# Patient Record
Sex: Male | Born: 1980 | Race: White | Hispanic: No | Marital: Married | Smoking: Current every day smoker
Health system: Southern US, Community
[De-identification: ages and names within clinical notes are randomized; demographics above are authoritative.]

## PROBLEM LIST (undated history)

## (undated) DIAGNOSIS — E663 Overweight: Secondary | ICD-10-CM

## (undated) HISTORY — DX: Overweight: E66.3

---

## 2017-06-07 ENCOUNTER — Emergency Department
Admission: EM | Admit: 2017-06-07 | Discharge: 2017-06-07 | Disposition: A | Discharge status: Home / Self Care | Payer: BLUE CROSS/BLUE SHIELD | Attending: Emergency Medicine | Admitting: Emergency Medicine

## 2017-06-07 ENCOUNTER — Emergency Department: Payer: BLUE CROSS/BLUE SHIELD

## 2017-06-07 ENCOUNTER — Encounter: Payer: Self-pay | Admitting: Emergency Medicine

## 2017-06-07 DIAGNOSIS — Y9389 Activity, other specified: Secondary | ICD-10-CM | POA: Insufficient documentation

## 2017-06-07 DIAGNOSIS — S81812A Laceration without foreign body, left lower leg, initial encounter: Secondary | ICD-10-CM

## 2017-06-07 DIAGNOSIS — S81012A Laceration without foreign body, left knee, initial encounter: Secondary | ICD-10-CM | POA: Insufficient documentation

## 2017-06-07 DIAGNOSIS — W293XXA Contact with powered garden and outdoor hand tools and machinery, initial encounter: Secondary | ICD-10-CM | POA: Insufficient documentation

## 2017-06-07 DIAGNOSIS — Y929 Unspecified place or not applicable: Secondary | ICD-10-CM | POA: Diagnosis not present

## 2017-06-07 DIAGNOSIS — S8992XA Unspecified injury of left lower leg, initial encounter: Secondary | ICD-10-CM | POA: Diagnosis present

## 2017-06-07 DIAGNOSIS — F17290 Nicotine dependence, other tobacco product, uncomplicated: Secondary | ICD-10-CM | POA: Insufficient documentation

## 2017-06-07 DIAGNOSIS — Y999 Unspecified external cause status: Secondary | ICD-10-CM | POA: Diagnosis not present

## 2017-06-07 MED ORDER — SULFAMETHOXAZOLE-TRIMETHOPRIM 800-160 MG PO TABS: 1.0000 | ORAL_TABLET | Freq: Two times a day (BID) | ORAL | 0 refills | Status: AC

## 2017-06-07 MED ORDER — LIDOCAINE HCL (PF) 1 % IJ SOLN
INTRAMUSCULAR | Status: AC
  Filled 2017-06-07: qty 10

## 2017-06-07 MED ORDER — LIDOCAINE HCL 1 % IJ SOLN
10.0000 mL | Freq: Once | INTRAMUSCULAR | Status: DC
  Filled 2017-06-07: qty 10

## 2017-06-07 NOTE — ED Triage Notes (Signed)
Patient presents to the ED with laceration to his left knee.  Patient states he was using a chain saw and accidentally cut his knee.  Laceration is approx. 2.5 inches long and adipose tissue is exposed.  Patient reports very little pain and no impaired ability to walk.  Patient denies any numbness or tingling to left leg.  Patient states incident occurred around 4pm.

## 2017-06-07 NOTE — ED Provider Notes (Signed)
[facility] Emergency Department Provider Note  ____________________________________________  Time seen: Approximately 11:25 PM  I have reviewed the triage vital signs and the nursing notes.   HISTORY  Chief Complaint Extremity Laceration    HPI Steven Cantrell is a 37 y.o. male presents to the emergency department with a 4 cm left knee laceration sustained with a chainsaw accidentally.  Patient denies weakness, radiculopathy or changes in sensation of the lower extremities.  His tetanus status is up-to-date.   History reviewed. No pertinent past medical history.  There are no active problems to display for this patient.   History reviewed. No pertinent surgical history.  Prior to Admission medications   Medication Sig Start Date End Date Taking? Authorizing Provider  sulfamethoxazole-trimethoprim (BACTRIM DS,SEPTRA DS) 800-160 MG tablet Take 1 tablet by mouth 2 (two) times daily for 7 days. 06/07/17 06/14/17  Orvil Feil, PA-C    Allergies Patient has no known allergies.  No family history on file.  Social History Social History   Tobacco Use  . Smoking status: Current Every Day Smoker    Types: E-cigarettes  . Smokeless tobacco: Never Used  Substance Use Topics  . Alcohol use: Yes    Comment: beer  . Drug use: Not on file     Review of Systems  Constitutional: No fever/chills Eyes: No visual changes. No discharge ENT: No upper respiratory complaints. Cardiovascular: no chest pain. Respiratory: no cough. No SOB. Gastrointestinal: No abdominal pain.  No nausea, no vomiting.  No diarrhea.  No constipation. Musculoskeletal: Negative for musculoskeletal pain. Skin: Patient has left knee laceration.  Neurological: Negative for headaches, focal weakness or numbness.   ____________________________________________   PHYSICAL EXAM:  VITAL SIGNS: ED Triage Vitals [06/07/17 1745]  Enc Vitals Group     BP (!) 133/99     Pulse Rate (!)  117     Resp 18     Temp 99 F (37.2 C)     Temp Source Oral     SpO2 97 %     Weight 220 lb (99.8 kg)     Height 6\' 4"  (1.93 m)     Head Circumference      Peak Flow      Pain Score      Pain Loc      Pain Edu?      Excl. in GC?      Constitutional: Alert and oriented. Well appearing and in no acute distress. Eyes: Conjunctivae are normal. PERRL. EOMI. Head: Atraumatic. ENT: Cardiovascular: Normal rate, regular rhythm. Normal S1 and S2.  Good peripheral circulation. Respiratory: Normal respiratory effort without tachypnea or retractions. Lungs CTAB. Good air entry to the bases with no decreased or absent breath sounds. Musculoskeletal: Full range of motion to all extremities. No gross deformities appreciated. Neurologic:  Normal speech and language. No gross focal neurologic deficits are appreciated.  Skin: Patient has a 4 cm left knee laceration. Psychiatric: Mood and affect are normal. Speech and behavior are normal. Patient exhibits appropriate insight and judgement.   ____________________________________________   LABS (all labs ordered are listed, but only abnormal results are displayed)  Labs Reviewed - No data to display ____________________________________________  EKG   ____________________________________________  RADIOLOGY Geraldo Pitter, personally viewed and evaluated these images (plain radiographs) as part of my medical decision making, as well as reviewing the written report by the radiologist.  Dg Knee Complete 4 Views Left  Result Date: 06/07/2017 CLINICAL DATA:  37 year old male  with left knee laceration from chainsaw at 1600 hours today. EXAM: LEFT KNEE - COMPLETE 4+ VIEW COMPARISON:  None. FINDINGS: Deep soft tissue laceration is visible along the medial knee corresponding to the distal left femoral metadiaphysis (image 2). There injury appears to extend within a centimeter of the periosteum but no underlying osseous injury is identified. No  joint effusion. No gas within the joint space. Bone mineralization is within normal limits. Normal joint spaces and alignment. IMPRESSION: 1. Soft tissue laceration at the level of the medial distal left femoral metadiaphysis. The injury appears to extend within a centimeter of the periosteum but no fracture or bone injury is identified. 2. Normal joint spaces.  No joint effusion or gas identified. Electronically Signed   By: Odessa FlemingH  Hall M.D.   On: 06/07/2017 21:07    ____________________________________________    PROCEDURES  Procedure(s) performed:    Procedures  LACERATION REPAIR Performed by: Orvil FeilJaclyn M Keisuke Hollabaugh Authorized by: Orvil FeilJaclyn M Naethan Bracewell Consent: Verbal consent obtained. Risks and benefits: risks, benefits and alternatives were discussed Consent given by: patient Patient identity confirmed: provided demographic data Prepped and Draped in normal sterile fashion Wound explored  Laceration Location: Left knee   Laceration Length: 4 cm  No Foreign Bodies seen or palpated  Anesthesia: local infiltration  Local anesthetic: lidocaine 1% without epinephrine  Anesthetic total: 7 ml  Irrigation method: syringe Amount of cleaning: standard  Skin closure:  Deep: 5-0 Monocryl: 4 Superficial: 4-0 Ethilon: 9  Technique: Simple Interrupted   Patient tolerance: Patient tolerated the procedure well with no immediate complications.    Medications  lidocaine (XYLOCAINE) 1 % (with pres) injection 10 mL (not administered)  lidocaine (PF) (XYLOCAINE) 1 % injection (not administered)     ____________________________________________   INITIAL IMPRESSION / ASSESSMENT AND PLAN / ED COURSE  Pertinent labs & imaging results that were available during my care of the patient were reviewed by me and considered in my medical decision making (see chart for details).  Review of the [place] CSRS was performed in accordance of the [facility] prior to dispensing any controlled drugs.     Assessment  and plan Left knee laceration Patient presents to the emergency department with a 4 cm left knee laceration that was repaired in the emergency department without complication.  Patient was advised to have external sutures removed by primary care in 10 days.  Patient was discharged with Keflex and advised to follow-up with primary care as needed.  All patient questions were answered.    ____________________________________________  FINAL CLINICAL IMPRESSION(S) / ED DIAGNOSES  Final diagnoses:  Laceration of left lower extremity, initial encounter      NEW MEDICATIONS STARTED DURING THIS VISIT:  ED Discharge Orders        Ordered    sulfamethoxazole-trimethoprim (BACTRIM DS,SEPTRA DS) 800-160 MG tablet  2 times daily     06/07/17 2134          This chart was dictated using voice recognition software/Dragon. Despite best efforts to proofread, errors can occur which can change the meaning. Any change was purely unintentional.    Orvil FeilWoods, Niylah Hassan M, PA-C 06/07/17 2329    Emily FilbertWilliams, Jonathan E, MD 06/08/17 [phone]

## 2017-06-23 DIAGNOSIS — N200 Calculus of kidney: Secondary | ICD-10-CM

## 2017-06-23 HISTORY — DX: Calculus of kidney: N20.0

## 2017-07-21 ENCOUNTER — Other Ambulatory Visit: Payer: Self-pay

## 2017-07-21 ENCOUNTER — Encounter: Payer: Self-pay | Admitting: Emergency Medicine

## 2017-07-21 ENCOUNTER — Emergency Department
Admission: EM | Admit: 2017-07-21 | Discharge: 2017-07-21 | Disposition: A | Discharge status: Home / Self Care | Payer: BLUE CROSS/BLUE SHIELD | Attending: Emergency Medicine | Admitting: Emergency Medicine

## 2017-07-21 DIAGNOSIS — R319 Hematuria, unspecified: Secondary | ICD-10-CM

## 2017-07-21 DIAGNOSIS — N200 Calculus of kidney: Secondary | ICD-10-CM | POA: Diagnosis not present

## 2017-07-21 DIAGNOSIS — R109 Unspecified abdominal pain: Secondary | ICD-10-CM | POA: Diagnosis present

## 2017-07-21 DIAGNOSIS — F17290 Nicotine dependence, other tobacco product, uncomplicated: Secondary | ICD-10-CM | POA: Insufficient documentation

## 2017-07-21 LAB — URINALYSIS, COMPLETE (UACMP) WITH MICROSCOPIC
Bilirubin Urine: NEGATIVE
GLUCOSE, UA: NEGATIVE mg/dL
KETONES UR: 20 mg/dL — AB
Leukocytes, UA: NEGATIVE
NITRITE: NEGATIVE
PROTEIN: NEGATIVE mg/dL
Specific Gravity, Urine: 1.028 (ref 1.005–1.030)
Squamous Epithelial / LPF: NONE SEEN (ref 0–5)
WBC UA: NONE SEEN WBC/hpf (ref 0–5)
pH: 5 (ref 5.0–8.0)

## 2017-07-21 LAB — CBC
HEMATOCRIT: 43.7 % (ref 40.0–52.0)
HEMOGLOBIN: 15.2 g/dL (ref 13.0–18.0)
MCH: 30.7 pg (ref 26.0–34.0)
MCHC: 34.8 g/dL (ref 32.0–36.0)
MCV: 88.3 fL (ref 80.0–100.0)
Platelets: 256 10*3/uL (ref 150–440)
RBC: 4.95 MIL/uL (ref 4.40–5.90)
RDW: 13.5 % (ref 11.5–14.5)
WBC: 13.4 10*3/uL — ABNORMAL HIGH (ref 3.8–10.6)

## 2017-07-21 LAB — COMPREHENSIVE METABOLIC PANEL
ALBUMIN: 5 g/dL (ref 3.5–5.0)
ALT: 26 U/L (ref 17–63)
ANION GAP: 8 (ref 5–15)
AST: 34 U/L (ref 15–41)
Alkaline Phosphatase: 52 U/L (ref 38–126)
BUN: 19 mg/dL (ref 6–20)
CO2: 26 mmol/L (ref 22–32)
Calcium: 9.3 mg/dL (ref 8.9–10.3)
Chloride: 103 mmol/L (ref 101–111)
Creatinine, Ser: 1.95 mg/dL — ABNORMAL HIGH (ref 0.61–1.24)
GFR calc non Af Amer: 43 mL/min — ABNORMAL LOW (ref >60)
GFR, EST AFRICAN AMERICAN: 50 mL/min — AB (ref >60)
GLUCOSE: 138 mg/dL — AB (ref 65–99)
POTASSIUM: 4.4 mmol/L (ref 3.5–5.1)
SODIUM: 137 mmol/L (ref 135–145)
Total Bilirubin: 1.1 mg/dL (ref 0.3–1.2)
Total Protein: 7.6 g/dL (ref 6.5–8.1)

## 2017-07-21 LAB — LIPASE, BLOOD: LIPASE: 23 U/L (ref 11–51)

## 2017-07-21 MED ORDER — TAMSULOSIN HCL 0.4 MG PO CAPS: 0.4000 mg | ORAL_CAPSULE | Freq: Every day | ORAL | 0 refills | Status: DC

## 2017-07-21 MED ORDER — OXYCODONE-ACETAMINOPHEN 5-325 MG PO TABS
1.0000 | ORAL_TABLET | ORAL | Status: DC | PRN
  Administered 2017-07-21: 1 via ORAL

## 2017-07-21 MED ORDER — KETOROLAC TROMETHAMINE 60 MG/2ML IM SOLN
60.0000 mg | Freq: Once | INTRAMUSCULAR | Status: AC
  Administered 2017-07-21: 60 mg via INTRAMUSCULAR
  Filled 2017-07-21: qty 2

## 2017-07-21 MED ORDER — ONDANSETRON HCL 4 MG PO TABS: 4.0000 mg | ORAL_TABLET | Freq: Three times a day (TID) | ORAL | 0 refills | Status: DC | PRN

## 2017-07-21 MED ORDER — KETOROLAC TROMETHAMINE 10 MG PO TABS: 10.0000 mg | ORAL_TABLET | Freq: Three times a day (TID) | ORAL | 0 refills | Status: DC | PRN

## 2017-07-21 MED ORDER — OXYCODONE-ACETAMINOPHEN 5-325 MG PO TABS
ORAL_TABLET | ORAL | Status: AC
  Filled 2017-07-21: qty 1

## 2017-07-21 NOTE — ED Provider Notes (Signed)
[facility] Emergency Department Provider Note   ____________________________________________   I have reviewed the triage vital signs and the nursing notes.   HISTORY  Chief Complaint Abdominal Pain  History limited by: Not Limited  HPI Steven Cantrell is a 37 y.o. male who presents to the emergency department today because of concern of abdominal pain. Located in the left flank. Started this morning and woke him from sleep. Sharp. Severe. Has been accompanied by nausea and vomiting. States that the pain has started to move and radiate down to the groin. Has not noticed any obvious change in urination. No discoloration. No bad odor.    History reviewed. No pertinent past medical history.  There are no active problems to display for this patient.   History reviewed. No pertinent surgical history.  Prior to Admission medications   Not on File    Allergies Patient has no known allergies.  History reviewed. No pertinent family history.  Social History Social History   Tobacco Use  . Smoking status: Current Every Day Smoker    Types: E-cigarettes  . Smokeless tobacco: Never Used  Substance Use Topics  . Alcohol use: Yes    Comment: beer  . Drug use: Not on file    Review of Systems Constitutional: No fever/chills Eyes: No visual changes. ENT: No sore throat. Cardiovascular: Denies chest pain. Respiratory: Denies shortness of breath. Gastrointestinal: Positive for left flank.  Genitourinary: Negative for dysuria. Musculoskeletal: Negative for back pain. Skin: Negative for rash. Neurological: Negative for headaches, focal weakness or numbness.  ____________________________________________   PHYSICAL EXAM:  VITAL SIGNS: ED Triage Vitals  Enc Vitals Group     BP 07/21/17 1627 122/83     Pulse Rate 07/21/17 1627 61     Resp 07/21/17 1627 18     Temp 07/21/17 1627 98.8 F (37.1 C)     Temp Source 07/21/17 1627 Oral     SpO2 07/21/17  1627 99 %     Weight 07/21/17 1626 220 lb (99.8 kg)     Height 07/21/17 1626  (1.93 m)     Head Circumference --      Peak Flow --      Pain Score 07/21/17 1626 6   Constitutional: Alert and oriented. Well appearing and in no distress. Eyes: Conjunctivae are normal.  ENT   Head: Normocephalic and atraumatic.   Nose: No congestion/rhinnorhea.   Mouth/Throat: Mucous membranes are moist.   Neck: No stridor. Hematological/Lymphatic/Immunilogical: No cervical lymphadenopathy. Cardiovascular: Normal rate, regular rhythm.  No murmurs, rubs, or gallops.  Respiratory: Normal respiratory effort without tachypnea nor retractions. Breath sounds are clear and equal bilaterally. No wheezes/rales/rhonchi. Gastrointestinal: Soft and minimally tender in the left abdomen. Genitourinary: Deferred Musculoskeletal: Normal range of motion in all extremities. No lower extremity edema. Neurologic:  Normal speech and language. No gross focal neurologic deficits are appreciated.  Skin:  Skin is warm, dry and intact. No rash noted. Psychiatric: Mood and affect are normal. Speech and behavior are normal. Patient exhibits appropriate insight and judgment.  ____________________________________________    LABS (pertinent positives/negatives)  Lipase 23 CMP glu 138, cr 1.95 CBC wbc 13.4, hgb 15.2, plt 256 UA 6-10 RBCs, moderate urine, none seen WBC ____________________________________________   EKG  None  ____________________________________________    RADIOLOGY  None  ____________________________________________   PROCEDURES  Procedures  ____________________________________________   INITIAL IMPRESSION / ASSESSMENT AND PLAN / ED COURSE  Pertinent labs & imaging results that were available during my  care of the patient were reviewed by me and considered in my medical decision making (see chart for details).  Patient presented to the emergency department today with  concern for left sided flank and radiating to groin pain.  Differential would be broad including diverticulitis, kidney stones, pyelonephritis, lower urinary tract infection on his other etiologies.  Patient did have red blood cells in his urine.  At this time I think most consistent with kidney stones.  Did offer ultrasound to evaluate for possible obstruction or hydronephrosis.  Patient felt comfortable deferring at this time.  He did get good pain control after Toradol injection.  Discussed return precautions.  Will discharge with pain medication nausea medication and Flomax.   ____________________________________________   FINAL CLINICAL IMPRESSION(S) / ED DIAGNOSES  Final diagnoses:  Kidney stone  Hematuria, unspecified type     Note: This dictation was prepared with Dragon dictation. Any transcriptional errors that result from this process are unintentional     Phineas Semen, MD 07/21/17 1904

## 2017-07-21 NOTE — Discharge Instructions (Addendum)
Please seek medical attention for any high fevers, chest pain, shortness of breath, change in behavior, persistent vomiting, bloody stool or any other new or concerning symptoms.  

## 2017-07-21 NOTE — ED Triage Notes (Signed)
C/o LLQ pain starting this AM.  Denies diarrhea, did vomit this AM.  Starting to move into groin. Denies hematuria.  Had flank pain this morning but now mostly in abdomen.

## 2018-04-29 ENCOUNTER — Emergency Department: Payer: BLUE CROSS/BLUE SHIELD

## 2018-04-29 ENCOUNTER — Other Ambulatory Visit: Payer: Self-pay

## 2018-04-29 ENCOUNTER — Emergency Department
Admission: EM | Admit: 2018-04-29 | Discharge: 2018-04-29 | Disposition: A | Discharge status: Home / Self Care | Payer: BLUE CROSS/BLUE SHIELD | Attending: Emergency Medicine | Admitting: Emergency Medicine

## 2018-04-29 DIAGNOSIS — F17290 Nicotine dependence, other tobacco product, uncomplicated: Secondary | ICD-10-CM | POA: Insufficient documentation

## 2018-04-29 DIAGNOSIS — N289 Disorder of kidney and ureter, unspecified: Secondary | ICD-10-CM | POA: Insufficient documentation

## 2018-04-29 DIAGNOSIS — R1031 Right lower quadrant pain: Secondary | ICD-10-CM | POA: Diagnosis present

## 2018-04-29 DIAGNOSIS — N23 Unspecified renal colic: Secondary | ICD-10-CM

## 2018-04-29 DIAGNOSIS — N1330 Unspecified hydronephrosis: Secondary | ICD-10-CM | POA: Diagnosis not present

## 2018-04-29 LAB — BASIC METABOLIC PANEL
Anion gap: 7 (ref 5–15)
BUN: 17 mg/dL (ref 6–20)
CHLORIDE: 104 mmol/L (ref 98–111)
CO2: 25 mmol/L (ref 22–32)
CREATININE: 1.63 mg/dL — AB (ref 0.61–1.24)
Calcium: 9.2 mg/dL (ref 8.9–10.3)
GFR calc Af Amer: >60 mL/min (ref >60)
GFR calc non Af Amer: 53 mL/min — ABNORMAL LOW (ref >60)
Glucose, Bld: 114 mg/dL — ABNORMAL HIGH (ref 70–99)
Potassium: 4 mmol/L (ref 3.5–5.1)
Sodium: 136 mmol/L (ref 135–145)

## 2018-04-29 LAB — URINALYSIS, COMPLETE (UACMP) WITH MICROSCOPIC
BACTERIA UA: NONE SEEN
Bilirubin Urine: NEGATIVE
Glucose, UA: NEGATIVE mg/dL
Ketones, ur: NEGATIVE mg/dL
Leukocytes, UA: NEGATIVE
Nitrite: NEGATIVE
PROTEIN: 30 mg/dL — AB
RBC / HPF: >50 RBC/hpf — ABNORMAL HIGH (ref 0–5)
SPECIFIC GRAVITY, URINE: 1.017 (ref 1.005–1.030)
pH: 5 (ref 5.0–8.0)

## 2018-04-29 LAB — CBC
HEMATOCRIT: 43.1 % (ref 39.0–52.0)
Hemoglobin: 15.1 g/dL (ref 13.0–17.0)
MCH: 30.1 pg (ref 26.0–34.0)
MCHC: 35 g/dL (ref 30.0–36.0)
MCV: 86 fL (ref 80.0–100.0)
Platelets: 257 10*3/uL (ref 150–400)
RBC: 5.01 MIL/uL (ref 4.22–5.81)
RDW: 12.2 % (ref 11.5–15.5)
WBC: 14.1 10*3/uL — ABNORMAL HIGH (ref 4.0–10.5)
nRBC: 0 % (ref 0.0–0.2)

## 2018-04-29 MED ORDER — TAMSULOSIN HCL 0.4 MG PO CAPS: 0.4000 mg | ORAL_CAPSULE | Freq: Every day | ORAL | 0 refills | Status: DC

## 2018-04-29 MED ORDER — ONDANSETRON HCL 4 MG PO TABS: 4.0000 mg | ORAL_TABLET | Freq: Three times a day (TID) | ORAL | 0 refills | Status: DC | PRN

## 2018-04-29 MED ORDER — KETOROLAC TROMETHAMINE 10 MG PO TABS: 10.0000 mg | ORAL_TABLET | Freq: Three times a day (TID) | ORAL | 0 refills | Status: DC | PRN

## 2018-04-29 MED ORDER — OXYCODONE-ACETAMINOPHEN 5-325 MG PO TABS: 1.0000 | ORAL_TABLET | ORAL | 0 refills | Status: AC | PRN

## 2018-04-29 NOTE — ED Provider Notes (Addendum)
[facility] Emergency Department Provider Note  ____________________________________________  Time seen: Approximately 6:22 PM  I have reviewed the triage vital signs and the nursing notes.   HISTORY  Chief Complaint Flank Pain    HPI Steven Cantrell is a 38 y.o. male with a prior history of left renal colic approximately 1 year ago presenting with right lower quadrant pain radiating to the right testicle.  The patient reports that several hours ago he began to have discomfort around the right flank to the right lower quadrant, with hematuria.  He had one episode of nausea and vomiting.  He had some leftover pain medication and antiemetic from his last kidney stone, so took that and now his symptoms have completely resolved.  He has a little bit of "pressure" sensation suprapubically.  He has not had any fevers or chills.  His last creatinine was elevated at 1.96 and he never followed up with urology; he states he went to an occupational health physician who told him his kidney function had returned returned to normal.  History reviewed. No pertinent past medical history.  There are no active problems to display for this patient.   History reviewed. No pertinent surgical history.  Current Outpatient Rx  . Order #: [id number] Class: Print  . Order #: [id number] Class: Print  . Order #: [id number] Class: Print  . Order #: [id number] Class: Print    Allergies Patient has no known allergies.  History reviewed. No pertinent family history.  Social History Social History   Tobacco Use  . Smoking status: Current Every Day Smoker    Types: E-cigarettes  . Smokeless tobacco: Never Used  Substance Use Topics  . Alcohol use: Yes    Comment: beer occ   . Drug use: Not on file    Review of Systems Constitutional: No fever/chills.  No lightheadedness or syncope. Eyes: No visual changes. ENT: No sore throat. No congestion or rhinorrhea. Cardiovascular: Denies chest  pain. Denies palpitations. Respiratory: Denies shortness of breath.  No cough. Gastrointestinal: Positive right flank pain associated with right lower quadrant abdominal pain.  Positive nausea, positive vomiting.  No diarrhea.  No constipation. Genitourinary: Negative for dysuria.  Positive hematuria. Musculoskeletal: Negative for back pain. Skin: Negative for rash. Neurological: Negative for headaches. No focal numbness, tingling or weakness.     ____________________________________________   PHYSICAL EXAM:  VITAL SIGNS: ED Triage Vitals [04/29/18 1710]  Enc Vitals Group     BP (!) 109/97     Pulse Rate 75     Resp 16     Temp 98.1 F (36.7 C)     Temp Source Oral     SpO2 99 %     Weight 220 lb (99.8 kg)     Height 6\' 4"  (1.93 m)     Head Circumference      Peak Flow      Pain Score 1     Pain Loc      Pain Edu?      Excl. in GC?     Constitutional: Alert and oriented. Answers questions appropriately. Eyes: Conjunctivae are normal.  EOMI. No scleral icterus. Head: Atraumatic. Nose: No congestion/rhinnorhea. Mouth/Throat: Mucous membranes are moist.  Neck: No stridor.  Supple.  No JVD.  No meningismus. Cardiovascular: Normal rate, regular rhythm. No murmurs, rubs or gallops.  Respiratory: Normal respiratory effort.  No accessory muscle use or retractions. Lungs CTAB.  No wheezes, rales or ronchi. Gastrointestinal: Soft, and nondistended.  Minimal tenderness to palpation in  the distal aspect of the right lower quadrant.  No guarding or rebound.  No peritoneal signs.  No reproducible CVA tenderness to palpation on the left or the right. Musculoskeletal: No LE edema. Neurologic:  A&Ox3.  Speech is clear.  Face and smile are symmetric.  EOMI.  Moves all extremities well. Skin:  Skin is warm, dry and intact. No rash noted. Psychiatric: Mood and affect are normal. Speech and behavior are normal.  Normal judgement.  ____________________________________________    LABS (all labs ordered are listed, but only abnormal results are displayed)  Labs Reviewed  URINALYSIS, COMPLETE (UACMP) WITH MICROSCOPIC - Abnormal; Notable for the following components:      Result Value   Color, Urine AMBER (*)    APPearance CLOUDY (*)    Hgb urine dipstick LARGE (*)    Protein, ur 30 (*)    RBC / HPF >50 (*)    All other components within normal limits  BASIC METABOLIC PANEL - Abnormal; Notable for the following components:   Glucose, Bld 114 (*)    Creatinine, Ser 1.63 (*)    GFR calc non Af Amer 53 (*)    All other components within normal limits  CBC - Abnormal; Notable for the following components:   WBC 14.1 (*)    All other components within normal limits   ____________________________________________  EKG  Not indicated ____________________________________________  RADIOLOGY  Ct Renal Stone Study  Result Date: 04/29/2018 CLINICAL DATA:  Right flank pain since early this afternoon. EXAM: CT ABDOMEN AND PELVIS WITHOUT CONTRAST TECHNIQUE: Multidetector CT imaging of the abdomen and pelvis was performed following the standard protocol without IV contrast. COMPARISON:  None. FINDINGS: Lower chest: The lung bases are clear of acute process. No pleural effusion or pulmonary lesions. The heart is normal in size. No pericardial effusion. The distal esophagus and aorta are unremarkable. Hepatobiliary: No focal hepatic lesions or intrahepatic biliary dilatation. The gallbladder is normal. No common bile duct dilatation. Pancreas: No mass, inflammation or ductal dilatation. Spleen: Normal size.  No focal lesions. Adrenals/Urinary Tract: The adrenal glands are normal. No renal calculi. There is mild/moderate right-sided hydroureteronephrosis down to an obstructing 3 mm calculus in the distal right ureter just proximal to the UVJ. No left-sided ureteral calculi. No bladder calculi. No worrisome renal or bladder lesions without contrast. Stomach/Bowel: The stomach,  duodenum, small bowel and colon are grossly normal without oral contrast. No inflammatory changes, mass lesions or obstructive findings. The terminal ileum and appendix are normal. Vascular/Lymphatic: The aorta is normal in caliber. No atheroscerlotic calcifications. No mesenteric of retroperitoneal mass or adenopathy. Small scattered lymph nodes are noted. Reproductive: The prostate gland and seminal vesicles are unremarkable. Bilateral scrotal hydroceles are noted. Other: No pelvic mass or adenopathy. No free pelvic fluid collections. No inguinal mass or adenopathy. No abdominal wall hernia or subcutaneous lesions. Musculoskeletal: No significant findings. IMPRESSION: 1. 3 mm distal right ureteral calculus causing mild/moderate hydroureteronephrosis. 2. No definite renal calculi and no left ureteral calculi. 3. No other significant abdominal/pelvic findings. Electronically Signed   By: Rudie MeyerP.  Gallerani M.D.   On: 04/29/2018 18:40    ____________________________________________   PROCEDURES  Procedure(s) performed: None  Procedures  Critical Care performed: No ____________________________________________   INITIAL IMPRESSION / ASSESSMENT AND PLAN / ED COURSE  Pertinent labs & imaging results that were available during my care of the patient were reviewed by me and considered in my medical decision making (see chart for details).  38 y.o. male with a  history of renal colic presenting with right flank pain radiating to the right lower quadrant, nausea and vomiting and hematuria.  The patient is hemodynamically stable and afebrile.  His clinical examination shows some right pelvic pain but is otherwise reassuring.  We will get a CT for further evaluation of stone disease.  Early appendicitis is not excluded.  The patient does have a significant amount of hematuria in his urinalysis without any evidence of infection.  Today, his creatinine is better than on prior visit but still abnormal at 1.63 and I  have asked him to please follow-up with urology for continued monitoring.  He does have an elevation in his white blood cell count at 14.  Plan reevaluation for final disposition.  ----------------------------------------- 7:10 PM on 04/29/2018 -----------------------------------------  The patient has a CT scan which is shows a 3 mm stone at the right UV J with some hydroureteronephrosis.  Overall, he continues to be hemodynamically stable and afebrile.  He is completely asymptomatic at this time.  He is aware that his white blood cell count is elevated and that his kidney function is not completely normal, so he will make an appointment for follow-up within the next 2 to 3 days with Dr. Apolinar Junes, the urologist on-call.  He has been able to drink several bottles of water since he has arrived here and I have talked to him about the importance of continued clear liquid p.o. intake.  Follow-up instructions as well as return precautions have been discussed.  Initially, had printed a Toradol prescription for the patient but I have voided this due to his renal insufficiency and given him precautions about NSAIDs.  He will use Tylenol and Percocet as needed for pain. ____________________________________________  FINAL CLINICAL IMPRESSION(S) / ED DIAGNOSES  Final diagnoses:  Renal insufficiency  Renal colic on right side  Hydroureteronephrosis         NEW MEDICATIONS STARTED DURING THIS VISIT:  New Prescriptions   OXYCODONE-ACETAMINOPHEN (PERCOCET) 5-325 MG TABLET    Take 1 tablet by mouth every 4 (four) hours as needed for severe pain.      Rockne Menghini, MD 04/29/18 1911    Rockne Menghini, MD 04/29/18 [phone]

## 2018-04-29 NOTE — Discharge Instructions (Addendum)
Please drink plenty of fluid to help your kidney stone pass, and to improve your abnormal kidney function.  We strain your urine and bring your kidney stone with you to your urology follow-up if you capture it.  For mild to moderate pain, you may take Tylenol.  Percocet is for severe pain; do not drive within 8 hours of taking Percocet.  Do not take any NSAID medications including Toradol, Advil, Motrin, ibuprofen; this can worsen your kidney function.  Please make a follow-up appoint with Dr. Apolinar Junes, the urologist on-call today.  Return to the emergency department if you develop fever, inability to keep down fluids, or pain that is not controlled with medications, or for any other symptoms concerning to you.

## 2018-04-29 NOTE — ED Notes (Signed)
ED Provider at bedside. 

## 2018-04-29 NOTE — ED Triage Notes (Signed)
Pt A&O, ambulatory. States believes he has a kidney stone. R flank pain. Hx of kidney stone a year ago. States hematuria. Vomiting x 1. Took nausea and pain medicine at home. Symptoms began today at 1:30.

## 2019-09-02 NOTE — Progress Notes (Signed)
Scheduled to complete physical on 09/09/19 with Durward Parcel, PA-C.  AMD

## 2019-09-03 ENCOUNTER — Other Ambulatory Visit: Payer: Self-pay

## 2019-09-03 ENCOUNTER — Ambulatory Visit: Payer: Self-pay

## 2019-09-03 DIAGNOSIS — Z0000 Encounter for general adult medical examination without abnormal findings: Secondary | ICD-10-CM

## 2019-09-03 LAB — POCT URINALYSIS DIPSTICK
Bilirubin, UA: NEGATIVE
Blood, UA: NEGATIVE
Glucose, UA: NEGATIVE
Leukocytes, UA: NEGATIVE
Nitrite, UA: NEGATIVE
Protein, UA: POSITIVE — AB
Spec Grav, UA: >=1.03 — AB (ref 1.010–1.025)
Urobilinogen, UA: 0.2 E.U./dL
pH, UA: 5 (ref 5.0–8.0)

## 2019-09-04 LAB — CMP12+LP+TP+TSH+6AC+CBC/D/PLT
ALT: 18 IU/L (ref 0–44)
AST: 15 IU/L (ref 0–40)
Albumin/Globulin Ratio: 2.6 — ABNORMAL HIGH (ref 1.2–2.2)
Albumin: 4.9 g/dL (ref 4.0–5.0)
Alkaline Phosphatase: 61 IU/L (ref 48–121)
BUN/Creatinine Ratio: 8 — ABNORMAL LOW (ref 9–20)
BUN: 12 mg/dL (ref 6–20)
Basophils Absolute: 0.1 10*3/uL (ref 0.0–0.2)
Basos: 1 %
Bilirubin Total: 0.5 mg/dL (ref 0.0–1.2)
Calcium: 9.5 mg/dL (ref 8.7–10.2)
Chloride: 103 mmol/L (ref 96–106)
Chol/HDL Ratio: 3.8 ratio (ref 0.0–5.0)
Cholesterol, Total: 150 mg/dL (ref 100–199)
Creatinine, Ser: 1.46 mg/dL — ABNORMAL HIGH (ref 0.76–1.27)
EOS (ABSOLUTE): 0.2 10*3/uL (ref 0.0–0.4)
Eos: 3 %
Estimated CHD Risk: 0.7 times avg. (ref 0.0–1.0)
Free Thyroxine Index: 2.2 (ref 1.2–4.9)
GFR calc Af Amer: 70 mL/min/{1.73_m2} (ref >59)
GFR calc non Af Amer: 61 mL/min/{1.73_m2} (ref >59)
GGT: 13 IU/L (ref 0–65)
Globulin, Total: 1.9 g/dL (ref 1.5–4.5)
Glucose: 102 mg/dL — ABNORMAL HIGH (ref 65–99)
HDL: 40 mg/dL (ref >39)
Hematocrit: 45.8 % (ref 37.5–51.0)
Hemoglobin: 15.6 g/dL (ref 13.0–17.7)
Immature Grans (Abs): 0 10*3/uL (ref 0.0–0.1)
Immature Granulocytes: 0 %
Iron: 110 ug/dL (ref 38–169)
LDH: 178 IU/L (ref 121–224)
LDL Chol Calc (NIH): 88 mg/dL (ref 0–99)
Lymphocytes Absolute: 2.2 10*3/uL (ref 0.7–3.1)
Lymphs: 36 %
MCH: 29.3 pg (ref 26.6–33.0)
MCHC: 34.1 g/dL (ref 31.5–35.7)
MCV: 86 fL (ref 79–97)
Monocytes Absolute: 0.6 10*3/uL (ref 0.1–0.9)
Monocytes: 10 %
Neutrophils Absolute: 3.2 10*3/uL (ref 1.4–7.0)
Neutrophils: 50 %
Phosphorus: 3 mg/dL (ref 2.8–4.1)
Platelets: 270 10*3/uL (ref 150–450)
Potassium: 4.8 mmol/L (ref 3.5–5.2)
RBC: 5.33 x10E6/uL (ref 4.14–5.80)
RDW: 12.5 % (ref 11.6–15.4)
Sodium: 139 mmol/L (ref 134–144)
T3 Uptake Ratio: 31 % (ref 24–39)
T4, Total: 7 ug/dL (ref 4.5–12.0)
TSH: 2.1 u[IU]/mL (ref 0.450–4.500)
Total Protein: 6.8 g/dL (ref 6.0–8.5)
Triglycerides: 119 mg/dL (ref 0–149)
Uric Acid: 6.9 mg/dL (ref 3.8–8.4)
VLDL Cholesterol Cal: 22 mg/dL (ref 5–40)
WBC: 6.2 10*3/uL (ref 3.4–10.8)

## 2019-09-09 ENCOUNTER — Ambulatory Visit: Payer: Self-pay | Admitting: Physician Assistant

## 2019-09-09 ENCOUNTER — Encounter: Payer: Self-pay | Admitting: Physician Assistant

## 2019-09-09 ENCOUNTER — Other Ambulatory Visit: Payer: Self-pay

## 2019-09-09 VITALS — BP 117/79 | HR 88 | Temp 97.0°F | Resp 14 | Ht 76.0 in | Wt 221.0 lb

## 2019-09-09 DIAGNOSIS — Z0000 Encounter for general adult medical examination without abnormal findings: Secondary | ICD-10-CM

## 2019-09-09 NOTE — Progress Notes (Signed)
° °  Subjective: Annual physical exam    Patient ID: Steven Cantrell, male    DOB: [birth year], 39 y.o.   MRN: [id number]  HPI Patient presents annual physical exam voices no concerns or complaints.   Review of Systems    Negative Objective:   Physical Exam No acute distress.  HEENT is unremarkable.  Neck is supple for adenopathy or bruits.  Lungs are clear to auscultation.  Heart regular rate and rhythm.  Abdomen with negative HSM, normoactive bowel sounds, soft, and nontender to palpation.  No obvious deformity to the upper or lower extremities.  Patient has full and equal range of motion of the upper and lower extremities.  No cervical or lumbar spine deformity.  Patient has full and equal range of motion of the cervical lumbar spine.  Cranial nerves II through XII are grossly intact.       Assessment & Plan: Well exam  Discussed lab results with patient.  Advised follow-up as needed.

## 2019-10-25 IMAGING — CT CT RENAL STONE PROTOCOL
3 of 4 series · 8 of 46 positions shown, 15 images · non-contrast
Comparison: None.

CLINICAL DATA: Right flank pain since early this afternoon.

EXAM:
CT ABDOMEN AND PELVIS WITHOUT CONTRAST
TECHNIQUE: Multidetector CT imaging of the abdomen and pelvis was performed
following the standard protocol without IV contrast.

[Series 4: lung bases · axial · 0.73mm/px · z∈[-153,-83]mm · 4 of 24 slices shown, 9 images]
[im 5/24  soft-tissue]
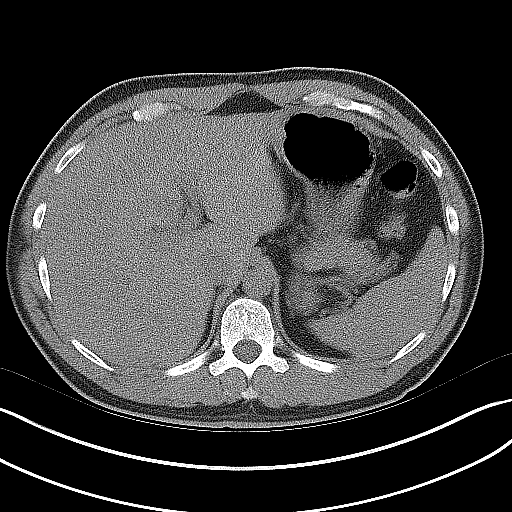
[im 5/24  lung]
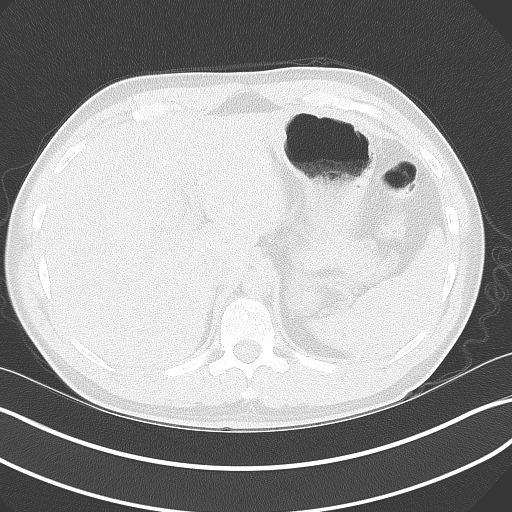
[im 5/24  bone]
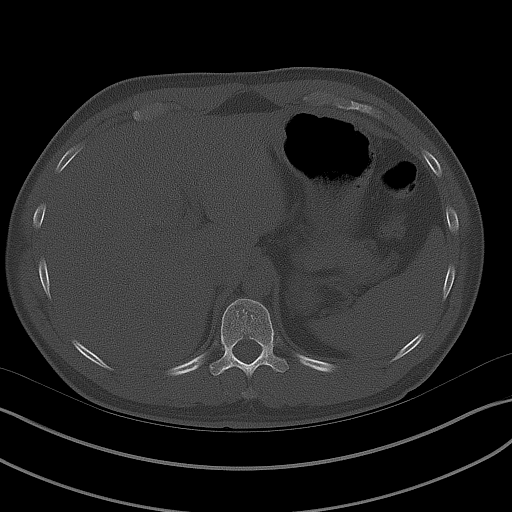
[im 10/24  soft-tissue]
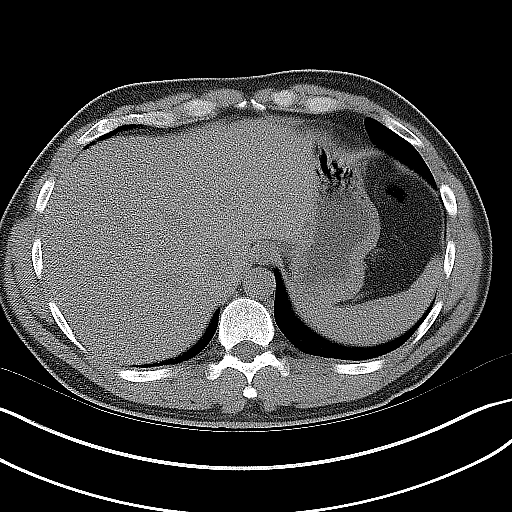
[im 10/24  lung]
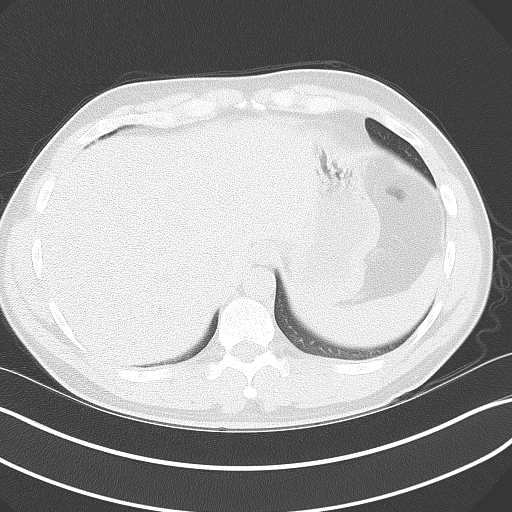
[im 14/24  soft-tissue]
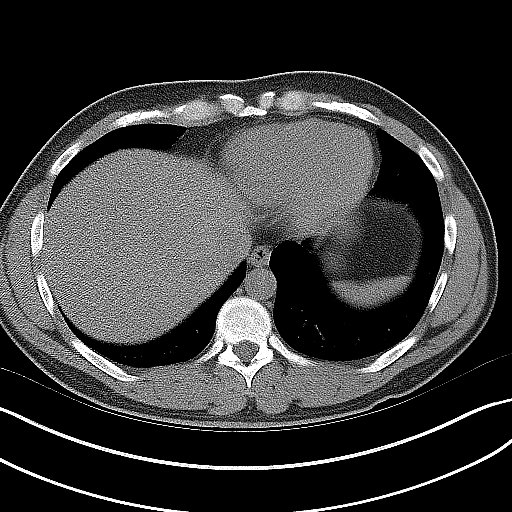
[im 14/24  lung]
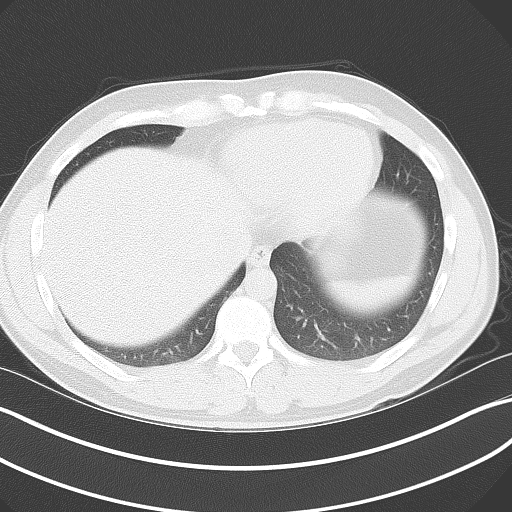
[im 19/24  soft-tissue]
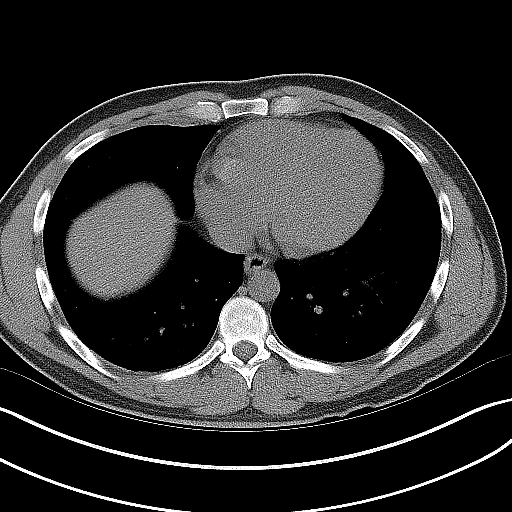
[im 19/24  lung]
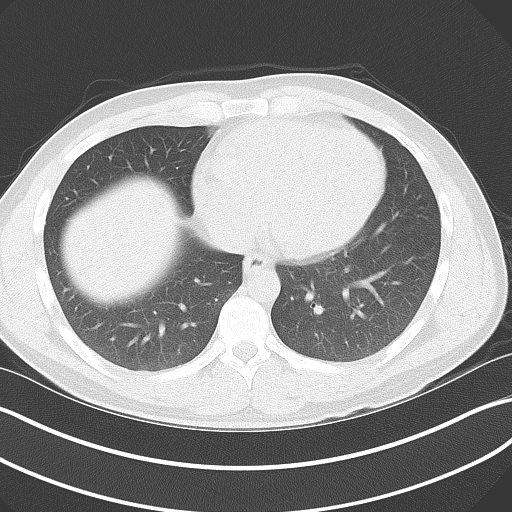

[Series 5: coronal · coronal · 0.81mm/px · 3 of 138 slices shown, 4 images]
[im 46/138  soft-tissue]
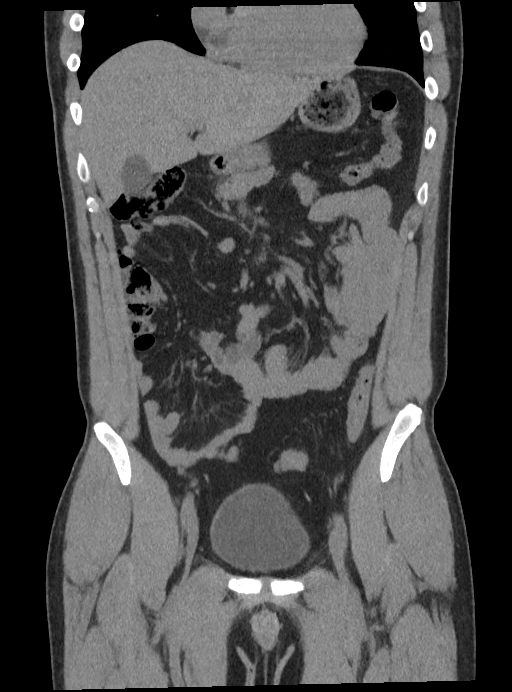
[im 61/138  soft-tissue]
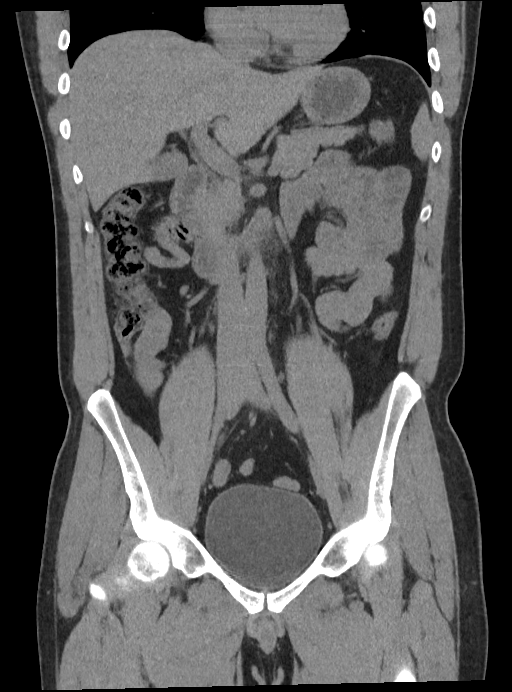
[im 61/138  bone]
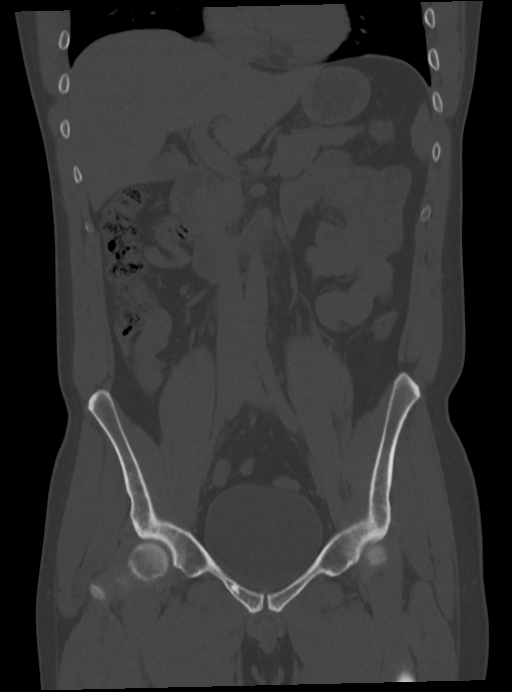
[im 77/138  soft-tissue]
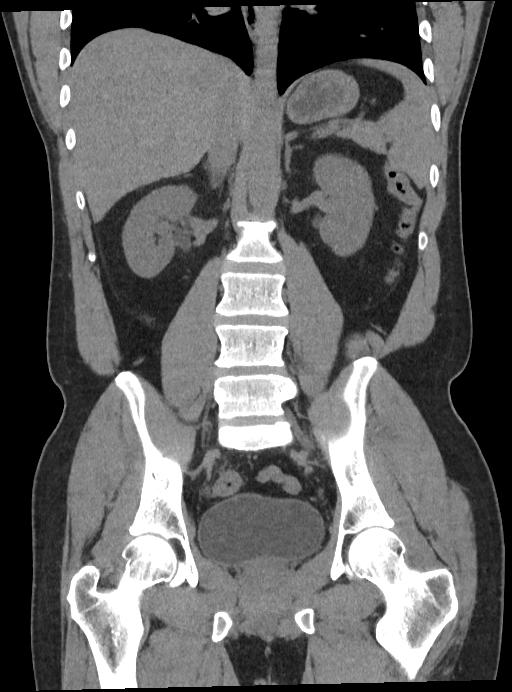

[Series 6: sagittal · sagittal · 0.54mm/px · 1 of 186 slices shown, 2 images]
[im 62/186  soft-tissue]
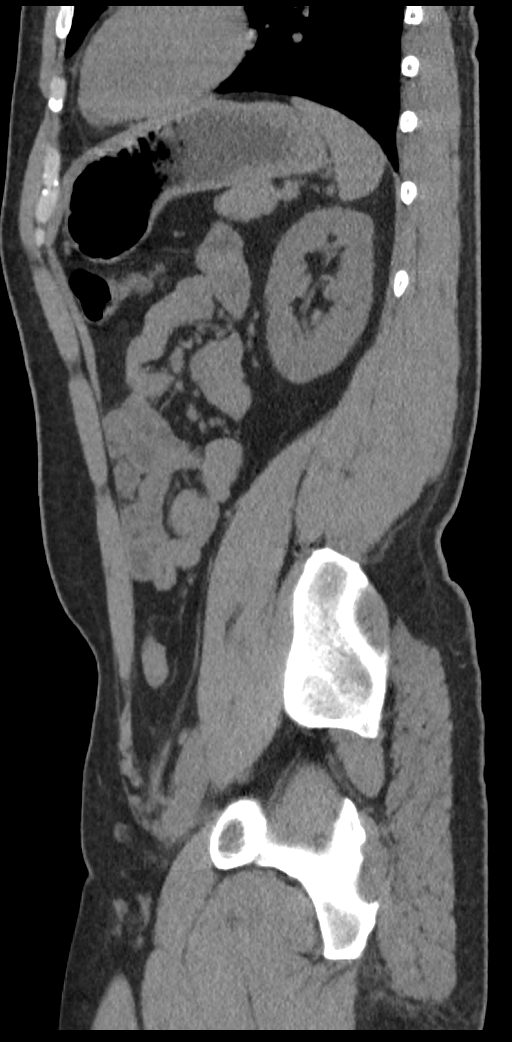
[im 62/186  bone]
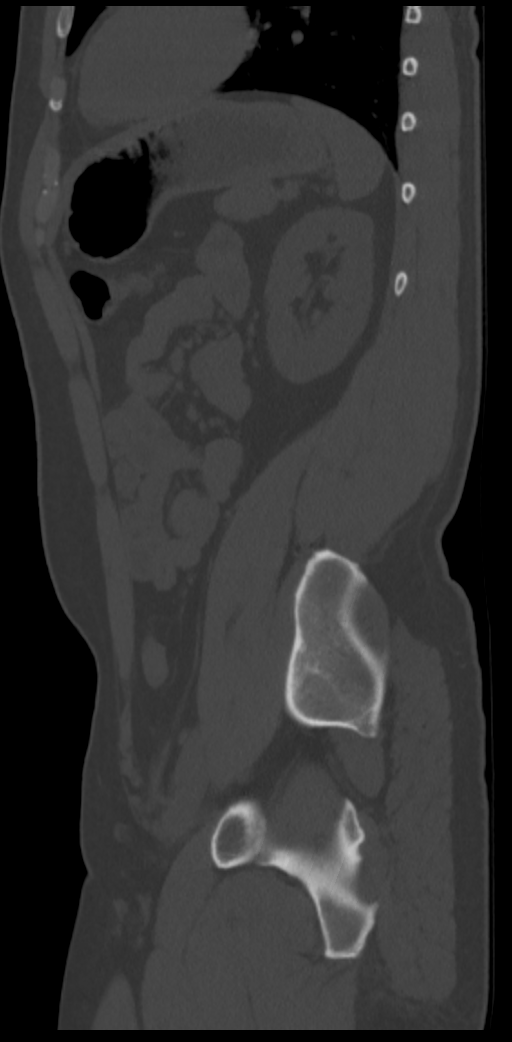

[8 of 46 positions shown; findings below may reference images not displayed]

FINDINGS: Lower chest: The lung bases are clear of acute process. No pleural
effusion or pulmonary lesions. The heart is normal in size. No
pericardial effusion. The distal esophagus and aorta are
unremarkable.

Hepatobiliary: No focal hepatic lesions or intrahepatic biliary
dilatation. The gallbladder is normal. No common bile duct
dilatation.

Pancreas: No mass, inflammation or ductal dilatation.

Spleen: Normal size.  No focal lesions.

Adrenals/Urinary Tract: The adrenal glands are normal.

No renal calculi. There is mild/moderate right-sided
hydroureteronephrosis down to an obstructing 3 mm calculus in the
distal right ureter just proximal to the UVJ. No left-sided ureteral
calculi. No bladder calculi. No worrisome renal or bladder lesions
without contrast.

Stomach/Bowel: The stomach, duodenum, small bowel and colon are
grossly normal without oral contrast. No inflammatory changes, mass
lesions or obstructive findings. The terminal ileum and appendix are
normal.

Vascular/Lymphatic: The aorta is normal in caliber. No
atheroscerlotic calcifications. No mesenteric of retroperitoneal
mass or adenopathy. Small scattered lymph nodes are noted.

Reproductive: The prostate gland and seminal vesicles are
unremarkable. Bilateral scrotal hydroceles are noted.

Other: No pelvic mass or adenopathy. No free pelvic fluid
collections. No inguinal mass or adenopathy. No abdominal wall
hernia or subcutaneous lesions.

Musculoskeletal: No significant findings.
IMPRESSION: 1. 3 mm distal right ureteral calculus causing mild/moderate
hydroureteronephrosis.
2. No definite renal calculi and no left ureteral calculi.
3. No other significant abdominal/pelvic findings.

## 2019-11-19 DIAGNOSIS — Z03818 Encounter for observation for suspected exposure to other biological agents ruled out: Secondary | ICD-10-CM | POA: Diagnosis not present

## 2020-08-17 ENCOUNTER — Other Ambulatory Visit: Payer: Self-pay

## 2020-08-17 ENCOUNTER — Ambulatory Visit: Payer: Self-pay

## 2020-08-17 DIAGNOSIS — Z0000 Encounter for general adult medical examination without abnormal findings: Secondary | ICD-10-CM

## 2020-08-17 DIAGNOSIS — Z0110 Encounter for examination of ears and hearing without abnormal findings: Secondary | ICD-10-CM | POA: Insufficient documentation

## 2020-08-17 DIAGNOSIS — Z139 Encounter for screening, unspecified: Secondary | ICD-10-CM | POA: Insufficient documentation

## 2020-08-17 DIAGNOSIS — S335XXA Sprain of ligaments of lumbar spine, initial encounter: Secondary | ICD-10-CM | POA: Insufficient documentation

## 2020-08-17 DIAGNOSIS — L02439 Carbuncle of limb, unspecified: Secondary | ICD-10-CM | POA: Insufficient documentation

## 2020-08-17 DIAGNOSIS — L089 Local infection of the skin and subcutaneous tissue, unspecified: Secondary | ICD-10-CM | POA: Insufficient documentation

## 2020-08-17 DIAGNOSIS — L02429 Furuncle of limb, unspecified: Secondary | ICD-10-CM | POA: Insufficient documentation

## 2020-08-17 LAB — POCT URINALYSIS DIPSTICK
Bilirubin, UA: NEGATIVE
Blood, UA: NEGATIVE
Glucose, UA: NEGATIVE
Leukocytes, UA: NEGATIVE
Nitrite, UA: NEGATIVE
Protein, UA: POSITIVE — AB
Spec Grav, UA: >=1.03 — AB (ref 1.010–1.025)
Urobilinogen, UA: 0.2 E.U./dL
pH, UA: 5.5 (ref 5.0–8.0)

## 2020-08-17 NOTE — Progress Notes (Signed)
Scheduled to complete physical 08/24/20 with Blima Ledger, FNP.  AMD

## 2020-08-18 LAB — CMP12+LP+TP+TSH+6AC+CBC/D/PLT
ALT: 26 IU/L (ref 0–44)
AST: 17 IU/L (ref 0–40)
Albumin/Globulin Ratio: 2.5 — ABNORMAL HIGH (ref 1.2–2.2)
Albumin: 4.9 g/dL (ref 4.0–5.0)
Alkaline Phosphatase: 61 IU/L (ref 44–121)
BUN/Creatinine Ratio: 7 — ABNORMAL LOW (ref 9–20)
BUN: 10 mg/dL (ref 6–20)
Basophils Absolute: 0 10*3/uL (ref 0.0–0.2)
Basos: 1 %
Bilirubin Total: 0.8 mg/dL (ref 0.0–1.2)
Calcium: 9.2 mg/dL (ref 8.7–10.2)
Chloride: 99 mmol/L (ref 96–106)
Chol/HDL Ratio: 3.7 ratio (ref 0.0–5.0)
Cholesterol, Total: 144 mg/dL (ref 100–199)
Creatinine, Ser: 1.43 mg/dL — ABNORMAL HIGH (ref 0.76–1.27)
EOS (ABSOLUTE): 0.2 10*3/uL (ref 0.0–0.4)
Eos: 3 %
Estimated CHD Risk: 0.6 times avg. (ref 0.0–1.0)
Free Thyroxine Index: 1.9 (ref 1.2–4.9)
GGT: 13 IU/L (ref 0–65)
Globulin, Total: 2 g/dL (ref 1.5–4.5)
Glucose: 103 mg/dL — ABNORMAL HIGH (ref 65–99)
HDL: 39 mg/dL — ABNORMAL LOW (ref >39)
Hematocrit: 45.5 % (ref 37.5–51.0)
Hemoglobin: 15.7 g/dL (ref 13.0–17.7)
Immature Grans (Abs): 0 10*3/uL (ref 0.0–0.1)
Immature Granulocytes: 0 %
Iron: 139 ug/dL (ref 38–169)
LDH: 190 IU/L (ref 121–224)
LDL Chol Calc (NIH): 86 mg/dL (ref 0–99)
Lymphocytes Absolute: 1.9 10*3/uL (ref 0.7–3.1)
Lymphs: 30 %
MCH: 29.4 pg (ref 26.6–33.0)
MCHC: 34.5 g/dL (ref 31.5–35.7)
MCV: 85 fL (ref 79–97)
Monocytes Absolute: 0.6 10*3/uL (ref 0.1–0.9)
Monocytes: 9 %
Neutrophils Absolute: 3.7 10*3/uL (ref 1.4–7.0)
Neutrophils: 57 %
Phosphorus: 2.5 mg/dL — ABNORMAL LOW (ref 2.8–4.1)
Platelets: 236 10*3/uL (ref 150–450)
Potassium: 4.2 mmol/L (ref 3.5–5.2)
RBC: 5.34 x10E6/uL (ref 4.14–5.80)
RDW: 12.6 % (ref 11.6–15.4)
Sodium: 138 mmol/L (ref 134–144)
T3 Uptake Ratio: 27 % (ref 24–39)
T4, Total: 7.2 ug/dL (ref 4.5–12.0)
TSH: 2.4 u[IU]/mL (ref 0.450–4.500)
Total Protein: 6.9 g/dL (ref 6.0–8.5)
Triglycerides: 103 mg/dL (ref 0–149)
Uric Acid: 5.8 mg/dL (ref 3.8–8.4)
VLDL Cholesterol Cal: 19 mg/dL (ref 5–40)
WBC: 6.3 10*3/uL (ref 3.4–10.8)
eGFR: 64 mL/min/{1.73_m2} (ref >59)

## 2020-08-24 ENCOUNTER — Other Ambulatory Visit: Payer: Self-pay

## 2020-08-24 ENCOUNTER — Ambulatory Visit: Payer: Self-pay | Admitting: Adult Health

## 2020-08-24 ENCOUNTER — Encounter: Payer: Self-pay | Admitting: Adult Health

## 2020-08-24 VITALS — BP 117/73 | HR 96 | Temp 98.4°F | Resp 14 | Ht 76.0 in | Wt 220.0 lb

## 2020-08-24 DIAGNOSIS — R7989 Other specified abnormal findings of blood chemistry: Secondary | ICD-10-CM

## 2020-08-24 DIAGNOSIS — Z0000 Encounter for general adult medical examination without abnormal findings: Secondary | ICD-10-CM

## 2020-08-24 DIAGNOSIS — N529 Male erectile dysfunction, unspecified: Secondary | ICD-10-CM

## 2020-08-24 MED ORDER — SILDENAFIL CITRATE 20 MG PO TABS: 20.0000 mg | ORAL_TABLET | Freq: Every day | ORAL | 0 refills | Status: DC | PRN

## 2020-08-24 NOTE — Progress Notes (Signed)
[facility] [address], [place] [address]  Internal MEDICINE  Office Visit Note  Patient Name: Steven Cantrell  [id number]  [id number]  Date of Service: 08/24/2020  Chief Complaint  Patient presents with  . Annual Exam     HPI Pt is here for routine health maintenance examination.  He is a well appearing 40 yo male who works as a heavy Teaching laboratory technician for [employer].  He has been Married for 16 years to his wife, and has two sons who are 41 and 6 years old.  His medical/surgical history are reviewed.  He does not take any medications at this time.  He has no allergies. He reports Vaping ($RemoveBeforeD'12mg'[id number]$  Nicotine), and smokes 1-2 cigarettes daily as well.  He reports occasional alcohol use, and denies illicit drug use.  He does drink a lot of soda, and has tried to cut it down to 2-3 days a week.  He does have concerns about erectile dysfunction.  He reports he is having difficulty maintaining an erection at times.  He has Morning erections frequently.  He believes stress has been a factor. A few occasions he reports delayed ejaculation.       Current Medication: Outpatient Encounter Medications as of 08/24/2020  Medication Sig  . sildenafil (REVATIO) 20 MG tablet Take 1 tablet (20 mg total) by mouth daily as needed.  . [DISCONTINUED] ketorolac (TORADOL) 10 MG tablet Take 1 tablet (10 mg total) by mouth every 8 (eight) hours as needed for severe pain (with food).  . [DISCONTINUED] ondansetron (ZOFRAN) 4 MG tablet Take 1 tablet (4 mg total) by mouth every 8 (eight) hours as needed for nausea or vomiting.  . [DISCONTINUED] tamsulosin (FLOMAX) 0.4 MG CAPS capsule Take 1 capsule (0.4 mg total) by mouth daily.   No facility-administered encounter medications on file as of 08/24/2020.    Surgical History: History reviewed. No pertinent surgical history.  Medical History: Past Medical History:  Diagnosis Date  . Kidney stone 06/2017  . Over weight     Family History: Family History   Family history unknown: Yes    Social History: Social History   Socioeconomic History  . Marital status: Married    Spouse name: Not on file  . Number of children: Not on file  . Years of education: Not on file  . Highest education level: Not on file  Occupational History  . Not on file  Tobacco Use  . Smoking status: Current Every Day Smoker    Types: E-cigarettes  . Smokeless tobacco: Never Used  . Tobacco comment: vape  Substance and Sexual Activity  . Alcohol use: Yes    Comment: beer occ   . Drug use: Never  . Sexual activity: Not on file  Other Topics Concern  . Not on file  Social History Narrative  . Not on file   Social Determinants of Health   Financial Resource Strain: Not on file  Food Insecurity: Not on file  Transportation Needs: Not on file  Physical Activity: Not on file  Stress: Not on file  Social Connections: Not on file      Review of Systems  Constitutional: Negative for activity change, appetite change and fatigue.  HENT: Negative for congestion, sinus pain, trouble swallowing and voice change.   Eyes: Negative for pain, discharge and visual disturbance.  Respiratory: Negative for cough, chest tightness and shortness of breath.   Cardiovascular: Negative for chest pain and leg swelling.  Gastrointestinal: Negative for  abdominal distention, abdominal pain, constipation and diarrhea.  Endocrine: Negative for cold intolerance and heat intolerance.  Genitourinary: Negative for flank pain, penile discharge, penile pain and testicular pain.       Difficulty maintaining erection.  Musculoskeletal: Negative for arthralgias, back pain and neck pain.  Skin: Negative for color change.  Neurological: Negative for dizziness, weakness and headaches.  Hematological: Negative for adenopathy.  Psychiatric/Behavioral: Negative for agitation, confusion and suicidal ideas.     Vital Signs: BP 117/73   Pulse 96   Temp 98.4 F (36.9 C)   Resp 14   Ht  6\' 4"  (1.93 m)   Wt 220 lb (99.8 kg)   SpO2 98%   BMI 26.78 kg/m    Physical Exam Constitutional:      Appearance: Normal appearance.  HENT:     Head: Normocephalic.     Right Ear: Tympanic membrane normal.     Left Ear: Tympanic membrane normal.     Nose: Nose normal.     Mouth/Throat:     Mouth: Mucous membranes are moist.     Pharynx: No oropharyngeal exudate or posterior oropharyngeal erythema.  Eyes:     General:        Right eye: No discharge.        Left eye: No discharge.     Extraocular Movements: Extraocular movements intact.     Pupils: Pupils are equal, round, and reactive to light.  Cardiovascular:     Rate and Rhythm: Normal rate and regular rhythm.     Pulses: Normal pulses.     Heart sounds: Normal heart sounds. No murmur heard.   Pulmonary:     Effort: Pulmonary effort is normal. No respiratory distress.     Breath sounds: Normal breath sounds. No wheezing or rhonchi.  Abdominal:     General: Abdomen is flat. Bowel sounds are normal. There is no distension.     Palpations: There is no mass.     Tenderness: There is no abdominal tenderness. There is no guarding.     Hernia: No hernia is present. There is no hernia in the left inguinal area or right inguinal area.  Genitourinary:    Penis: Normal and circumcised. No phimosis, hypospadias or tenderness.      Testes: Normal.        Right: Mass, tenderness or swelling not present.        Left: Mass, tenderness or swelling not present.     Epididymis:     Right: Normal.     Left: Normal.  Musculoskeletal:        General: No swelling or deformity. Normal range of motion.     Cervical back: Normal range of motion.  Lymphadenopathy:     Lower Body: No right inguinal adenopathy. No left inguinal adenopathy.  Skin:    General: Skin is warm and dry.     Capillary Refill: Capillary refill takes less than 2 seconds.  Neurological:     General: No focal deficit present.     Mental Status: He is alert.      Cranial Nerves: No cranial nerve deficit.     Gait: Gait normal.  Psychiatric:        Mood and Affect: Mood normal.        Behavior: Behavior normal.        Judgment: Judgment normal.      LABS: Recent Results (from the past 2160 hour(s))  CMP12+LP+TP+TSH+6AC+CBC/D/Plt     Status: Abnormal  Collection Time: 08/17/20  8:47 AM  Result Value Ref Range   Glucose 103 (H) 65 - 99 mg/dL   Uric Acid 5.8 3.8 - 8.4 mg/dL    Comment:            Therapeutic target for gout patients: <6.0   BUN 10 6 - 20 mg/dL   Creatinine, Ser 1.43 (H) 0.76 - 1.27 mg/dL   eGFR 64 >59 mL/min/1.73   BUN/Creatinine Ratio 7 (L) 9 - 20   Sodium 138 134 - 144 mmol/L   Potassium 4.2 3.5 - 5.2 mmol/L   Chloride 99 96 - 106 mmol/L   Calcium 9.2 8.7 - 10.2 mg/dL   Phosphorus 2.5 (L) 2.8 - 4.1 mg/dL   Total Protein 6.9 6.0 - 8.5 g/dL   Albumin 4.9 4.0 - 5.0 g/dL   Globulin, Total 2.0 1.5 - 4.5 g/dL   Albumin/Globulin Ratio 2.5 (H) 1.2 - 2.2   Bilirubin Total 0.8 0.0 - 1.2 mg/dL   Alkaline Phosphatase 61 44 - 121 IU/L   LDH 190 121 - 224 IU/L   AST 17 0 - 40 IU/L   ALT 26 0 - 44 IU/L   GGT 13 0 - 65 IU/L   Iron 139 38 - 169 ug/dL   Cholesterol, Total 144 100 - 199 mg/dL   Triglycerides 103 0 - 149 mg/dL   HDL 39 (L) >39 mg/dL   VLDL Cholesterol Cal 19 5 - 40 mg/dL   LDL Chol Calc (NIH) 86 0 - 99 mg/dL   Chol/HDL Ratio 3.7 0.0 - 5.0 ratio    Comment:                                   T. Chol/HDL Ratio                                             Men  Women                               1/2 Avg.Risk  3.4    3.3                                   Avg.Risk  5.0    4.4                                2X Avg.Risk  9.6    7.1                                3X Avg.Risk 23.4   11.0    Estimated CHD Risk 0.6 0.0 - 1.0 times avg.    Comment: The CHD Risk is based on the T. Chol/HDL ratio. Other factors affect CHD Risk such as hypertension, smoking, diabetes, severe obesity, and family history of premature CHD.     TSH 2.400 0.450 - 4.500 uIU/mL   T4, Total 7.2 4.5 - 12.0 ug/dL   T3 Uptake Ratio 27 24 - 39 %   Free Thyroxine Index 1.9 1.2 - 4.9   WBC 6.3  3.4 - 10.8 x10E3/uL   RBC 5.34 4.14 - 5.80 x10E6/uL   Hemoglobin 15.7 13.0 - 17.7 g/dL   Hematocrit 45.5 37.5 - 51.0 %   MCV 85 79 - 97 fL   MCH 29.4 26.6 - 33.0 pg   MCHC 34.5 31.5 - 35.7 g/dL   RDW 12.6 11.6 - 15.4 %   Platelets 236 150 - 450 x10E3/uL   Neutrophils 57 Not Estab. %   Lymphs 30 Not Estab. %   Monocytes 9 Not Estab. %   Eos 3 Not Estab. %   Basos 1 Not Estab. %   Neutrophils Absolute 3.7 1.4 - 7.0 x10E3/uL   Lymphocytes Absolute 1.9 0.7 - 3.1 x10E3/uL   Monocytes Absolute 0.6 0.1 - 0.9 x10E3/uL   EOS (ABSOLUTE) 0.2 0.0 - 0.4 x10E3/uL   Basophils Absolute 0.0 0.0 - 0.2 x10E3/uL   Immature Granulocytes 0 Not Estab. %   Immature Grans (Abs) 0.0 0.0 - 0.1 x10E3/uL  POCT urinalysis dipstick     Status: Abnormal   Collection Time: 08/17/20  9:23 AM  Result Value Ref Range   Color, UA dark yellow    Clarity, UA cloudy    Glucose, UA Negative Negative   Bilirubin, UA negative    Ketones, UA trace    Spec Grav, UA >=1.030 (A) 1.010 - 1.025   Blood, UA negative    pH, UA 5.5 5.0 - 8.0   Protein, UA Positive (A) Negative    Comment: trace   Urobilinogen, UA 0.2 0.2 or 1.0 E.U./dL   Nitrite, UA negative    Leukocytes, UA Negative Negative   Appearance dark    Odor       Assessment/Plan: 1. Annual physical exam Up to date on PHM.  2. Elevated serum creatinine Mildly improved from last year.  Discussed rechecking labs before next year, and also discussed possible renal US, if creatinine continues to be elevated. Return in 4-6 months for recheck.   3. Erectile dysfunction, unspecified erectile dysfunction type Use sildenafil as discussed.   - sildenafil (REVATIO) 20 MG tablet; Take 1 tablet (20 mg total) by mouth daily as needed.  Dispense: 30 tablet; Refill: 0    General Counseling: vittorio mohs understanding of  the findings of todays visit and agrees with plan of treatment. I have discussed any further diagnostic evaluation that may be needed or ordered today. We also reviewed his medications today. he has been encouraged to call the office with any questions or concerns that should arise related to todays visit.    No orders of the defined types were placed in this encounter.   Meds ordered this encounter  Medications  . sildenafil (REVATIO) 20 MG tablet    Sig: Take 1 tablet (20 mg total) by mouth daily as needed.    Dispense:  30 tablet    Refill:  0    Total time spent: 40 Minutes  Time spent includes review of chart, medications, test results, and follow up plan with the patient.    Kendell Bane AGNP-C Nurse Practitioner

## 2020-08-24 NOTE — Progress Notes (Signed)
Pt presents today to complete physical with Adam Scarboro,NP 

## 2020-10-09 ENCOUNTER — Other Ambulatory Visit: Payer: Self-pay

## 2020-10-09 DIAGNOSIS — N529 Male erectile dysfunction, unspecified: Secondary | ICD-10-CM

## 2020-10-09 MED ORDER — SILDENAFIL CITRATE 20 MG PO TABS: 20.0000 mg | ORAL_TABLET | Freq: Every day | ORAL | 0 refills | Status: DC | PRN

## 2021-01-25 ENCOUNTER — Other Ambulatory Visit: Payer: 59

## 2021-01-26 ENCOUNTER — Other Ambulatory Visit: Payer: Self-pay

## 2021-01-26 DIAGNOSIS — R7989 Other specified abnormal findings of blood chemistry: Secondary | ICD-10-CM

## 2021-01-26 NOTE — Progress Notes (Signed)
Pt presents today for creatine level check post 6 month per adam scarboro,NP.

## 2021-01-27 LAB — BUN+CREAT
BUN/Creatinine Ratio: 9 (ref 9–20)
BUN: 12 mg/dL (ref 6–20)
Creatinine, Ser: 1.4 mg/dL — ABNORMAL HIGH (ref 0.76–1.27)
eGFR: 66 mL/min/{1.73_m2} (ref >59)

## 2021-01-29 ENCOUNTER — Telehealth: Payer: Self-pay

## 2021-01-29 NOTE — Telephone Encounter (Signed)
Pt answered; per Laray Anger   Lab results are improving and no need for follow up apt./CL,RMA

## 2021-08-23 ENCOUNTER — Ambulatory Visit: Payer: Self-pay

## 2021-08-23 DIAGNOSIS — Z0000 Encounter for general adult medical examination without abnormal findings: Secondary | ICD-10-CM

## 2021-08-23 NOTE — Progress Notes (Signed)
Pt presents today for physical labs, will return to clinic for scheduled physical.  

## 2021-08-24 LAB — CMP12+LP+TP+TSH+6AC+CBC/D/PLT
ALT: 21 IU/L (ref 0–44)
AST: 15 IU/L (ref 0–40)
Albumin/Globulin Ratio: 2.8 — ABNORMAL HIGH (ref 1.2–2.2)
Albumin: 4.8 g/dL (ref 4.0–5.0)
Alkaline Phosphatase: 62 IU/L (ref 44–121)
BUN/Creatinine Ratio: 8 — ABNORMAL LOW (ref 9–20)
BUN: 11 mg/dL (ref 6–20)
Basophils Absolute: 0.1 10*3/uL (ref 0.0–0.2)
Basos: 1 %
Bilirubin Total: 0.7 mg/dL (ref 0.0–1.2)
Calcium: 9.4 mg/dL (ref 8.7–10.2)
Chloride: 100 mmol/L (ref 96–106)
Chol/HDL Ratio: 4 ratio (ref 0.0–5.0)
Cholesterol, Total: 157 mg/dL (ref 100–199)
Creatinine, Ser: 1.39 mg/dL — ABNORMAL HIGH (ref 0.76–1.27)
EOS (ABSOLUTE): 0.2 10*3/uL (ref 0.0–0.4)
Eos: 3 %
Estimated CHD Risk: 0.7 times avg. (ref 0.0–1.0)
Free Thyroxine Index: 2 (ref 1.2–4.9)
GGT: 13 IU/L (ref 0–65)
Globulin, Total: 1.7 g/dL (ref 1.5–4.5)
Glucose: 128 mg/dL — ABNORMAL HIGH (ref 70–99)
HDL: 39 mg/dL — ABNORMAL LOW (ref >39)
Hematocrit: 45.9 % (ref 37.5–51.0)
Hemoglobin: 15.9 g/dL (ref 13.0–17.7)
Immature Grans (Abs): 0 10*3/uL (ref 0.0–0.1)
Immature Granulocytes: 0 %
Iron: 147 ug/dL (ref 38–169)
LDH: 168 IU/L (ref 121–224)
LDL Chol Calc (NIH): 98 mg/dL (ref 0–99)
Lymphocytes Absolute: 2.4 10*3/uL (ref 0.7–3.1)
Lymphs: 39 %
MCH: 29.7 pg (ref 26.6–33.0)
MCHC: 34.6 g/dL (ref 31.5–35.7)
MCV: 86 fL (ref 79–97)
Monocytes Absolute: 0.6 10*3/uL (ref 0.1–0.9)
Monocytes: 10 %
Neutrophils Absolute: 2.9 10*3/uL (ref 1.4–7.0)
Neutrophils: 47 %
Phosphorus: 3.1 mg/dL (ref 2.8–4.1)
Platelets: 256 10*3/uL (ref 150–450)
Potassium: 4.2 mmol/L (ref 3.5–5.2)
RBC: 5.35 x10E6/uL (ref 4.14–5.80)
RDW: 12.5 % (ref 11.6–15.4)
Sodium: 138 mmol/L (ref 134–144)
T3 Uptake Ratio: 26 % (ref 24–39)
T4, Total: 7.8 ug/dL (ref 4.5–12.0)
TSH: 2.07 u[IU]/mL (ref 0.450–4.500)
Total Protein: 6.5 g/dL (ref 6.0–8.5)
Triglycerides: 110 mg/dL (ref 0–149)
Uric Acid: 5.8 mg/dL (ref 3.8–8.4)
VLDL Cholesterol Cal: 20 mg/dL (ref 5–40)
WBC: 6.2 10*3/uL (ref 3.4–10.8)
eGFR: 66 mL/min/{1.73_m2} (ref >59)

## 2021-08-29 ENCOUNTER — Encounter: Payer: Self-pay | Admitting: Physician Assistant

## 2021-08-29 ENCOUNTER — Ambulatory Visit: Payer: Self-pay | Admitting: Physician Assistant

## 2021-08-29 VITALS — BP 127/80 | HR 97 | Temp 97.6°F | Resp 14 | Ht 76.0 in | Wt 220.0 lb

## 2021-08-29 DIAGNOSIS — Z0000 Encounter for general adult medical examination without abnormal findings: Secondary | ICD-10-CM

## 2021-08-29 LAB — POCT URINALYSIS DIPSTICK
Bilirubin, UA: NEGATIVE
Blood, UA: NEGATIVE
Glucose, UA: NEGATIVE
Ketones, UA: NEGATIVE
Leukocytes, UA: NEGATIVE
Nitrite, UA: NEGATIVE
Protein, UA: POSITIVE — AB
Spec Grav, UA: >=1.03 — AB (ref 1.010–1.025)
Urobilinogen, UA: 0.2 E.U./dL
pH, UA: 5.5 (ref 5.0–8.0)

## 2021-08-29 NOTE — Progress Notes (Signed)
[facility] ____________________________________________   None    (approximate)  I have reviewed the triage vital signs and the nursing notes.   HISTORY  Chief Complaint No chief complaint on file.  HPI Steven Cantrell is a 41 y.o. male male presents for annual physical exam.  Patient voices no concerns or complaints.    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  Prior to Admission medications   Medication Sig Start Date End Date Taking? Authorizing Provider  sildenafil (REVATIO) 20 MG tablet Take 1 tablet (20 mg total) by mouth daily as needed. 10/09/20   Sable Feil, PA-C    Allergies Patient has no known allergies.  Family History  Family history unknown: Yes    Social History Social History   Tobacco Use   Smoking status: Every Day    Types: E-cigarettes   Smokeless tobacco: Never   Tobacco comments:    vape  Substance Use Topics   Alcohol use: Yes    Comment: beer occ    Drug use: Never    Review of Systems Constitutional: No fever/chills Eyes: No visual changes. ENT: No sore throat. Cardiovascular: Denies chest pain. Respiratory: Denies shortness of breath. Gastrointestinal: No abdominal pain.  No nausea, no vomiting.  No diarrhea.  No constipation. Genitourinary: Negative for dysuria. Musculoskeletal: Negative for back pain. Skin: Negative for rash. Neurological: Negative for headaches, focal weakness or numbness.  ____________________________________________   PHYSICAL EXAM:  VITAL SIGNS: BP is 127/80, pulse 97, respiration 14,  temperature 97.6, and patient 99% O2 sat on room air.  Patient weighs 220 pounds and BMI is 26.78. Constitutional: Alert and oriented. Well appearing and in no acute distress. Eyes: Conjunctivae are normal. PERRL. EOMI. Head: Atraumatic. Nose: No congestion/rhinnorhea. Mouth/Throat: Mucous membranes are moist.  Oropharynx non-erythematous. Neck: No stridor.  No cervical spine tenderness to palpation. Hematological/Lymphatic/Immunilogical: No cervical lymphadenopathy. Cardiovascular: Normal rate, regular rhythm. Grossly normal heart sounds.  Good peripheral circulation. Respiratory: Normal respiratory effort.  No retractions. Lungs CTAB. Gastrointestinal: Soft and nontender. No distention. No abdominal bruits. No CVA tenderness. Genitourinary: Deferred Musculoskeletal: No lower extremity tenderness nor edema.  No joint effusions. Neurologic:  Normal speech and language. No gross focal neurologic deficits are appreciated. No gait instability. Skin:  Skin is warm, dry and intact. No rash noted. Psychiatric: Mood and affect are normal. Speech and behavior are normal.  ____________________________________________   LABS            Component Ref Range & Units 6 d ago (08/23/21) 7 mo ago (01/26/21) 1 yr ago (08/17/20) 1 yr ago (09/03/19) 3 yr ago (04/29/18) 3 yr ago (04/29/18) 4 yr ago (07/21/17) 4 yr ago (07/21/17)  Glucose 70 - 99 mg/dL 128 High    103 High  R  102 High  R  114 High    138 High  R    Uric Acid 3.8 - 8.4 mg/dL 5.8   5.8 CM  6.9 CM       Comment:            Therapeutic  target for gout patients: <6.0  BUN 6 - 20 mg/dL _0 Creatinine, Ser 0.76 - 1.27 mg/dL 1.39 High   1.40 High   1.43 High   1.46 High   1.63 High  R   1.95 High  R    eGFR >59 mL/min/1.73 66  66  64        BUN/Creatinine Ratio 9 - 20 8 Low   9  7 Low   8 Low        Sodium 134 - 144 mmol/L 138   138  139  136 R   137 R    Potassium 3.5 - 5.2 mmol/L 4.2   4.2  4.8  4.0 R   4.4 R    Chloride 96  - 106 mmol/L 100   99  103  104 R   103 R    Calcium 8.7 - 10.2 mg/dL 9.4   9.2  9.5  9.2 R   9.3 R    Phosphorus 2.8 - 4.1 mg/dL 3.1   2.5 Low   3.0       Total Protein 6.0 - 8.5 g/dL 6.5   6.9  6.8    7.6 R    Albumin 4.0 - 5.0 g/dL 4.8   4.9  4.9    5.0 R    Globulin, Total 1.5 - 4.5 g/dL 1.7   2.0  1.9       Albumin/Globulin Ratio 1.2 - 2.2 2.8 High    2.5 High   2.6 High        Bilirubin Total 0.0 - 1.2 mg/dL 0.7   0.8  0.5    1.1 R    Alkaline Phosphatase 44 - 121 IU/L 62   61  61 R    52 R    LDH 121 - 224 IU/L 168   190  178       AST 0 - 40 IU/L _1 34 R    ALT 0 - 44 IU/L _2 R    GGT 0 - 65 IU/L _3 Iron 38 - 169 ug/dL 147   139  110       Cholesterol, Total 100 - 199 mg/dL 157   144  150       Triglycerides 0 - 149 mg/dL 110   103  119       HDL >39 mg/dL 39 Low    39 Low   40       VLDL Cholesterol Cal 5 - 40 mg/dL _4 LDL Chol Calc (NIH) 0 - 99 mg/dL 98   86  88       Chol/HDL Ratio 0.0 - 5.0 ratio 4.0   3.7 CM  3.8 CM       Comment:                                   T. Chol/HDL Ratio  Men  Women                                1/2 Avg.Risk  3.4    3.3                                    Avg.Risk  5.0    4.4                                 2X Avg.Risk  9.6    7.1                                 3X Avg.Risk 23.4   11.0   Estimated CHD Risk 0.0 - 1.0 times avg. 0.7   0.6 CM  0.7 CM       Comment: The CHD Risk is based on the T. Chol/HDL ratio. Other  factors affect CHD Risk such as hypertension, smoking,  diabetes, severe obesity, and family history of  premature CHD.   TSH 0.450 - 4.500 uIU/mL 2.070   2.400  2.100       T4, Total 4.5 - 12.0 ug/dL 7.8   7.2  7.0       T3 Uptake Ratio 24 - 39 % _0 Free Thyroxine Index 1.2 - 4.9 2.0   1.9  2.2       WBC 3.4 - 10.8 x10E3/uL 6.2   6.3  6.2   14.1 High  R   13.4 High  R   RBC 4.14 - 5.80 x10E6/uL 5.35   5.34  5.33    5.01 R   4.95 R   Hemoglobin 13.0 - 17.7 g/dL 15.9   15.7  15.6   15.1 R   15.2 R   Hematocrit 37.5 - 51.0 % 45.9   45.5  45.8   43.1 R   43.7 R   MCV 79 - 97 fL 86   85  86   86.0 R   88.3 R   MCH 26.6 - 33.0 pg 29.7   29.4  29.3   30.1 R   30.7 R   MCHC 31.5 - 35.7 g/dL 34.6   34.5  34.1   35.0 R   34.8 R   RDW 11.6 - 15.4 % 12.5   12.6  12.5   12.2 R   13.5 R   Platelets 150 - 450 x10E3/uL 256   236  270   257 R   256 R, CM   Neutrophils Not Estab. % 47   57  50       Lymphs Not Estab. % 39   30  36       Monocytes Not Estab. % _1 Eos Not Estab. % _2 Basos Not Estab. % _3 Neutrophils Absolute 1.4 - 7.0 x10E3/uL 2.9   3.7  3.2       Lymphocytes Absolute 0.7 - 3.1 x10E3/uL 2.4   1.9  2.2  Monocytes Absolute 0.1 - 0.9 x10E3/uL 0.6   0.6  0.6       EOS (ABSOLUTE) 0.0 - 0.4 x10E3/uL 0.2   0.2  0.2       Basophils Absolute 0.0 - 0.2 x10E3/uL 0.1   0.0  0.1       Immature Granulocytes Not Estab. % 0   0  0       Immature Grans               _______________________________     ____________________________________________   ____________________________________________   INITIAL IMPRESSION / ASSESSMENT AND PLAN  As part of my medical decision making, I reviewed the following data within the [facility]       Discussed lab results with patient.      ____________________________________________   FINAL CLINICAL IMPRESSION  Well exam   ED Discharge Orders     None        Note:  This document was prepared using Dragon voice recognition software and may include unintentional dictation errors.

## 2022-01-24 ENCOUNTER — Other Ambulatory Visit: Payer: Self-pay

## 2022-01-24 DIAGNOSIS — N529 Male erectile dysfunction, unspecified: Secondary | ICD-10-CM

## 2022-01-25 MED ORDER — SILDENAFIL CITRATE 20 MG PO TABS: 20.0000 mg | ORAL_TABLET | Freq: Every day | ORAL | 2 refills | Status: AC | PRN

## 2022-08-30 ENCOUNTER — Ambulatory Visit: Payer: Self-pay

## 2022-08-30 DIAGNOSIS — Z0000 Encounter for general adult medical examination without abnormal findings: Secondary | ICD-10-CM

## 2022-08-30 LAB — POCT URINALYSIS DIPSTICK
Bilirubin, UA: NEGATIVE
Blood, UA: NEGATIVE
Glucose, UA: NEGATIVE
Ketones, UA: NEGATIVE
Leukocytes, UA: NEGATIVE
Nitrite, UA: NEGATIVE
Protein, UA: POSITIVE — AB
Spec Grav, UA: >=1.03 — AB (ref 1.010–1.025)
Urobilinogen, UA: 0.2 E.U./dL
pH, UA: 6 (ref 5.0–8.0)

## 2022-08-30 NOTE — Progress Notes (Signed)
Pt completed labs for annual physical. /CL,RMA 

## 2022-08-31 LAB — CMP12+LP+TP+TSH+6AC+PSA+CBC…
ALT: 33 IU/L (ref 0–44)
AST: 19 IU/L (ref 0–40)
Albumin/Globulin Ratio: 2.9 — ABNORMAL HIGH (ref 1.2–2.2)
Albumin: 4.7 g/dL (ref 4.1–5.1)
Alkaline Phosphatase: 60 IU/L (ref 44–121)
BUN/Creatinine Ratio: 9 (ref 9–20)
BUN: 11 mg/dL (ref 6–24)
Basophils Absolute: 0 10*3/uL (ref 0.0–0.2)
Basos: 1 %
Bilirubin Total: 0.5 mg/dL (ref 0.0–1.2)
Calcium: 9.5 mg/dL (ref 8.7–10.2)
Chloride: 102 mmol/L (ref 96–106)
Chol/HDL Ratio: 3.9 ratio (ref 0.0–5.0)
Cholesterol, Total: 168 mg/dL (ref 100–199)
Creatinine, Ser: 1.22 mg/dL (ref 0.76–1.27)
EOS (ABSOLUTE): 0.4 10*3/uL (ref 0.0–0.4)
Eos: 5 %
Estimated CHD Risk: 0.7 times avg. (ref 0.0–1.0)
Free Thyroxine Index: 2 (ref 1.2–4.9)
GGT: 14 IU/L (ref 0–65)
Globulin, Total: 1.6 g/dL (ref 1.5–4.5)
Glucose: 97 mg/dL (ref 70–99)
HDL: 43 mg/dL (ref >39)
Hematocrit: 45.9 % (ref 37.5–51.0)
Hemoglobin: 15.6 g/dL (ref 13.0–17.7)
Immature Grans (Abs): 0 10*3/uL (ref 0.0–0.1)
Immature Granulocytes: 0 %
Iron: 114 ug/dL (ref 38–169)
LDH: 151 IU/L (ref 121–224)
LDL Chol Calc (NIH): 97 mg/dL (ref 0–99)
Lymphocytes Absolute: 2.4 10*3/uL (ref 0.7–3.1)
Lymphs: 34 %
MCH: 29.8 pg (ref 26.6–33.0)
MCHC: 34 g/dL (ref 31.5–35.7)
MCV: 88 fL (ref 79–97)
Monocytes Absolute: 0.6 10*3/uL (ref 0.1–0.9)
Monocytes: 9 %
Neutrophils Absolute: 3.5 10*3/uL (ref 1.4–7.0)
Neutrophils: 51 %
Phosphorus: 3.3 mg/dL (ref 2.8–4.1)
Platelets: 246 10*3/uL (ref 150–450)
Potassium: 4.1 mmol/L (ref 3.5–5.2)
Prostate Specific Ag, Serum: 0.5 ng/mL (ref 0.0–4.0)
RBC: 5.24 x10E6/uL (ref 4.14–5.80)
RDW: 13.3 % (ref 11.6–15.4)
Sodium: 139 mmol/L (ref 134–144)
T3 Uptake Ratio: 28 % (ref 24–39)
T4, Total: 7 ug/dL (ref 4.5–12.0)
TSH: 2.39 u[IU]/mL (ref 0.450–4.500)
Total Protein: 6.3 g/dL (ref 6.0–8.5)
Triglycerides: 159 mg/dL — ABNORMAL HIGH (ref 0–149)
Uric Acid: 5.9 mg/dL (ref 3.8–8.4)
VLDL Cholesterol Cal: 28 mg/dL (ref 5–40)
WBC: 6.9 10*3/uL (ref 3.4–10.8)
eGFR: 77 mL/min/{1.73_m2} (ref >59)

## 2022-09-03 ENCOUNTER — Ambulatory Visit: Payer: Self-pay | Admitting: Adult Health

## 2022-09-03 ENCOUNTER — Encounter: Payer: Self-pay | Admitting: Adult Health

## 2022-09-03 VITALS — BP 126/81 | HR 98 | Resp 16 | Ht 76.0 in | Wt 220.0 lb

## 2022-09-03 DIAGNOSIS — Z0001 Encounter for general adult medical examination with abnormal findings: Secondary | ICD-10-CM

## 2022-09-03 DIAGNOSIS — E781 Pure hyperglyceridemia: Secondary | ICD-10-CM

## 2022-09-03 NOTE — Progress Notes (Signed)
Pt presents today to complete physical, Pt denies any issues or concerns at this time/CL,RMA 

## 2022-09-03 NOTE — Progress Notes (Signed)
[facility] [address], [place] [address]   Office Visit Note  Patient Name: Steven Cantrell  [id number]  [id number]  Date of Service: 09/03/2022  Chief Complaint  Patient presents with   Annual Exam     HPI Pt is here for a medical exam.  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  He has been Married for 18 years to his wife, and has two sons who are 11 and 90 years old.   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  He needs this form completed so that he can participate.  He denies any issues at this time.  He has not complaints.  His only medication is Sildenafil, which he takes occasionally. He continues to use Nicotine products. He reports occasional alcohol use, and denies illicit drug use.      Current Medication:  Outpatient Encounter Medications as of 09/03/2022  Medication Sig   sildenafil (REVATIO) 20 MG tablet Take 1 tablet (20 mg total) by mouth daily as needed.   No facility-administered encounter medications on file as of 09/03/2022.      Medical History: Past Medical History:  Diagnosis Date   Kidney stone 06/2017   Over weight      Vital Signs: BP 126/81   Pulse 98   Resp 16   Ht 6\' 4"  (1.93 m)   Wt 220 lb (99.8 kg)   SpO2 99%   BMI 26.78 kg/m    Review of Systems  Constitutional:  Negative for chills, fatigue and fever.  HENT:  Negative for congestion, ear pain, postnasal drip and sore throat.   Eyes:  Negative for pain, discharge and itching.  Respiratory:  Negative for cough and shortness of breath.   Cardiovascular:  Negative for chest pain and leg swelling.  Gastrointestinal:  Negative for constipation, diarrhea, nausea and vomiting.  Musculoskeletal:  Negative for myalgias and neck pain.  Skin:  Negative for color change.    Physical Exam Vitals and nursing note reviewed.  Constitutional:      Appearance: Normal appearance.  HENT:     Head:  Normocephalic.     Right Ear: Tympanic membrane and ear canal normal.     Left Ear: Tympanic membrane and ear canal normal.     Nose: Nose normal.     Mouth/Throat:     Mouth: Mucous membranes are moist.  Eyes:     Pupils: Pupils are equal, round, and reactive to light.  Cardiovascular:     Rate and Rhythm: Normal rate.  Pulmonary:     Effort: Pulmonary effort is normal.     Breath sounds: Normal breath sounds.  Abdominal:     General: Abdomen is flat. Bowel sounds are normal.  Musculoskeletal:        General: Normal range of motion.     Cervical back: Normal range of motion.  Neurological:     General: No focal deficit present.     Mental Status: He is alert and oriented to person, place, and time.  Psychiatric:        Mood and Affect: Mood normal.        Behavior: Behavior normal.        Thought Content: Thought content normal.    Assessment/Plan: 1. Encounter for general adult medical examination with abnormal findings UP to date on PHM. No concerns based on  exam or history for patient's participation in activities for the scout camp.  Form completed, signed and returned to patient at this time.    2. High blood triglycerides Triglycerides were elevated which is abnormal looking back at his previous labs.  PT confirmed he had coffee with vanilla cream and sugar before labs were drawn.      General Counseling: keisean skowron understanding of the findings of todays visit and agrees with plan of treatment. I have discussed any further diagnostic evaluation that may be needed or ordered today. We also reviewed his medications today. he has been encouraged to call the office with any questions or concerns that should arise related to todays visit.   No orders of the defined types were placed in this encounter.   No orders of the defined types were placed in this encounter.   Time spent:25 Minutes    Johnna Acosta AGNP-C Nurse Practitioner

## 2023-02-07 DIAGNOSIS — M545 Low back pain, unspecified: Secondary | ICD-10-CM | POA: Diagnosis not present

## 2023-08-21 DIAGNOSIS — A4902 Methicillin resistant Staphylococcus aureus infection, unspecified site: Secondary | ICD-10-CM | POA: Insufficient documentation

## 2023-08-21 DIAGNOSIS — S39012A Strain of muscle, fascia and tendon of lower back, initial encounter: Secondary | ICD-10-CM | POA: Insufficient documentation

## 2023-08-22 ENCOUNTER — Ambulatory Visit: Payer: Self-pay

## 2023-08-22 DIAGNOSIS — Z0000 Encounter for general adult medical examination without abnormal findings: Secondary | ICD-10-CM

## 2023-08-22 LAB — POCT URINALYSIS DIPSTICK
Blood, UA: NEGATIVE
Glucose, UA: NEGATIVE
Ketones, UA: NEGATIVE
Leukocytes, UA: NEGATIVE
Nitrite, UA: NEGATIVE
Protein, UA: POSITIVE — AB
Spec Grav, UA: >=1.03 — AB
Urobilinogen, UA: 0.2 U/dL
pH, UA: 6

## 2023-08-23 LAB — CMP12+LP+TP+TSH+6AC+PSA+CBC…
ALT: 31 IU/L (ref 0–44)
AST: 20 IU/L (ref 0–40)
Albumin: 4.7 g/dL (ref 4.1–5.1)
Alkaline Phosphatase: 66 IU/L (ref 44–121)
BUN/Creatinine Ratio: 8 — ABNORMAL LOW (ref 9–20)
BUN: 11 mg/dL (ref 6–24)
Basophils Absolute: 0 10*3/uL (ref 0.0–0.2)
Basos: 1 %
Bilirubin Total: 0.8 mg/dL (ref 0.0–1.2)
Calcium: 9.3 mg/dL (ref 8.7–10.2)
Chloride: 101 mmol/L (ref 96–106)
Chol/HDL Ratio: 4.2 ratio (ref 0.0–5.0)
Cholesterol, Total: 173 mg/dL (ref 100–199)
Creatinine, Ser: 1.39 mg/dL — ABNORMAL HIGH (ref 0.76–1.27)
EOS (ABSOLUTE): 0.3 10*3/uL (ref 0.0–0.4)
Eos: 4 %
Estimated CHD Risk: 0.8 times avg. (ref 0.0–1.0)
Free Thyroxine Index: 2.1 (ref 1.2–4.9)
GGT: 14 IU/L (ref 0–65)
Globulin, Total: 2 g/dL (ref 1.5–4.5)
Glucose: 98 mg/dL (ref 70–99)
HDL: 41 mg/dL (ref >39)
Hematocrit: 48.4 % (ref 37.5–51.0)
Hemoglobin: 16.1 g/dL (ref 13.0–17.7)
Immature Grans (Abs): 0 10*3/uL (ref 0.0–0.1)
Immature Granulocytes: 0 %
Iron: 164 ug/dL (ref 38–169)
LDH: 176 IU/L (ref 121–224)
LDL Chol Calc (NIH): 109 mg/dL — ABNORMAL HIGH (ref 0–99)
Lymphocytes Absolute: 2.5 10*3/uL (ref 0.7–3.1)
Lymphs: 37 %
MCH: 30.1 pg (ref 26.6–33.0)
MCHC: 33.3 g/dL (ref 31.5–35.7)
MCV: 91 fL (ref 79–97)
Monocytes Absolute: 0.7 10*3/uL (ref 0.1–0.9)
Monocytes: 10 %
Neutrophils Absolute: 3.3 10*3/uL (ref 1.4–7.0)
Neutrophils: 48 %
Phosphorus: 2.8 mg/dL (ref 2.8–4.1)
Platelets: 256 10*3/uL (ref 150–450)
Potassium: 4.3 mmol/L (ref 3.5–5.2)
Prostate Specific Ag, Serum: 0.5 ng/mL (ref 0.0–4.0)
RBC: 5.34 x10E6/uL (ref 4.14–5.80)
RDW: 12.9 % (ref 11.6–15.4)
Sodium: 140 mmol/L (ref 134–144)
T3 Uptake Ratio: 28 % (ref 24–39)
T4, Total: 7.5 ug/dL (ref 4.5–12.0)
TSH: 2.35 u[IU]/mL (ref 0.450–4.500)
Total Protein: 6.7 g/dL (ref 6.0–8.5)
Triglycerides: 129 mg/dL (ref 0–149)
Uric Acid: 6 mg/dL (ref 3.8–8.4)
VLDL Cholesterol Cal: 23 mg/dL (ref 5–40)
WBC: 6.8 10*3/uL (ref 3.4–10.8)
eGFR: 65 mL/min/{1.73_m2} (ref >59)

## 2023-08-28 ENCOUNTER — Encounter: Payer: Self-pay | Admitting: Physician Assistant

## 2023-08-28 ENCOUNTER — Ambulatory Visit: Payer: Self-pay | Admitting: Physician Assistant

## 2023-08-28 VITALS — BP 116/85 | HR 96 | Temp 98.0°F | Resp 16 | Ht 76.0 in | Wt 220.0 lb

## 2023-08-28 DIAGNOSIS — R809 Proteinuria, unspecified: Secondary | ICD-10-CM

## 2023-08-28 DIAGNOSIS — Z0000 Encounter for general adult medical examination without abnormal findings: Secondary | ICD-10-CM

## 2023-08-28 NOTE — Progress Notes (Signed)
Pt presents today to complete physical, Pt denies any issues  at this time/CL,RMA

## 2023-08-28 NOTE — Progress Notes (Signed)
 [facility] ____________________________________________   None    (approximate)  I have reviewed the triage vital signs and the nursing notes.   HISTORY  Chief Complaint No chief complaint on file.   HPI Steven Cantrell is a 43 y.o. male patient presents for annual physical exam.  Patient denies any concerns or complaints.  Discussed patient lab results consisting chronic proteinuria.  Patient states open complaints and states he had kidney stones in 2019.         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  Prior to Admission medications   Medication Sig Start Date End Date Taking? Authorizing Provider  sildenafil  (REVATIO ) 20 MG tablet Take 1 tablet (20 mg total) by mouth daily as needed. 01/25/22   Marcina Severe, PA-C    Allergies Patient has no known allergies.  Family History  Family history unknown: Yes    Social History Social History   Tobacco Use   Smoking status: Every Day    Types: E-cigarettes   Smokeless tobacco: Never   Tobacco comments:    vape  Substance Use Topics   Alcohol use: Yes    Comment: beer occ    Drug use: Never    Review of Systems Constitutional: No fever/chills Eyes: No visual changes. ENT: No sore throat. Cardiovascular: Denies chest pain. Respiratory: Denies shortness of breath. Gastrointestinal: No abdominal pain.  No nausea, no vomiting.  No diarrhea.  No constipation. Genitourinary: Negative for dysuria.  Persistent proteinuria Musculoskeletal: Negative for back  pain. Skin: Negative for rash. Neurological: Negative for headaches, focal weakness or numbness.  ____________________________________________   PHYSICAL EXAM:  VITAL SIGNS: BP 116/85  Cuff Size Large  Pulse Rate 96  Temp 98 F (36.7 C)  Temp Source Temporal  Weight 220 lb (99.8 kg)  Height 6\' 4"  (1.93 m)  Resp 16  SpO2 98 %   Other Vitals   BMI: 26.78 kg/m2  BSA: 2.31 m2   Constitutional: Alert and oriented. Well appearing and in no acute distress. Eyes: Conjunctivae are normal. PERRL. EOMI. Head: Atraumatic. Nose: No congestion/rhinnorhea. Mouth/Throat: Mucous membranes are moist.  Oropharynx non-erythematous. Neck: No stridor.  No cervical spine tenderness to palpation. Hematological/Lymphatic/Immunilogical: No cervical lymphadenopathy. Cardiovascular: Normal rate, regular rhythm. Grossly normal heart sounds.  Good peripheral circulation. Respiratory: Normal respiratory effort.  No retractions. Lungs CTAB. Gastrointestinal: Soft and nontender. No distention. No abdominal bruits. No CVA tenderness. Genitourinary: Deferred Musculoskeletal: No lower extremity tenderness nor edema.  No joint effusions. Neurologic:  Normal speech and language. No gross focal neurologic deficits are appreciated. No gait instability. Skin:  Skin is warm, dry and intact. No rash noted. Psychiatric: Mood and affect are normal. Speech and behavior are normal.  ____________________________________________   LABS            Component Ref Range & Units (hover) 6 d ago (08/22/23) 12 mo ago (08/30/22) 1 yr ago (08/29/21) 3 yr ago (08/17/20) 3 yr ago (09/03/19) 5 yr ago (04/29/18) 6 yr ago (07/21/17)  Color, UA amber [employer] planner dark  yellow yellow    Clarity, UA clear Clear Clear cloudy clear    Glucose, UA Negative Negative Negative Negative Negative    Bilirubin, UA 1+ Abnormal  Negative Negative negative negative    Ketones, UA neg Negative Negative trace +-5    Spec Grav, UA >=1.030  Abnormal  >=1.030 Abnormal  >=1.030 Abnormal  >=1.030 Abnormal  >=1.030 Abnormal     Blood, UA neg Negative Negative negative negative    pH, UA 6.0 6.0 5.5 5.5 5.0    Protein, UA Positive Abnormal  Positive Abnormal  CM Positive Abnormal  CM Positive Abnormal  CM Positive Abnormal  CM    Comment: 1+  Urobilinogen, UA 0.2 0.2 0.2 0.2 0.2    Nitrite, UA neg Negative Negative negative negative    Leukocytes, UA Negative Negative Negative Negative Negative NEGATIVE R NEGATIVE R  Appearance    dark  CLOUDY Abnormal  R TURBID Abnormal  R  Odor                      Ref Range & Units (hover) 6 d ago (08/22/23) 12 mo ago (08/30/22) 2 yr ago (08/23/21) 2 yr ago (01/26/21) 3 yr ago (08/17/20) 3 yr ago (09/03/19) 5 yr ago (04/29/18) 5 yr ago (04/29/18)  Glucose 98 97 128 High   103 High  R 102 High  R 114 High    Uric Acid 6.0 5.9 CM 5.8 CM  5.8 CM 6.9 CM    Comment:            Therapeutic target for gout patients: <6.0  BUN 11 11 11  R 12 R 10 R 12 R 17 R   Creatinine, Ser 1.39 High  1.22 1.39 High  1.40 High  1.43 High  1.46 High  1.63 High  R   eGFR 65 77 66 66 64     BUN/Creatinine Ratio 8 Low  9 8 Low  9 7 Low  8 Low     Sodium 140 139 138  138 139 136 R   Potassium 4.3 4.1 4.2  4.2 4.8 4.0 R   Chloride 101 102 100  99 103 104 R   Calcium 9.3 9.5 9.4  9.2 9.5 9.2 R   Phosphorus 2.8 3.3 3.1  2.5 Low  3.0    Total Protein 6.7 6.3 6.5  6.9 6.8    Albumin 4.7 4.7 4.8 R  4.9 R 4.9 R    Globulin, Total 2.0 1.6 1.7  2.0 1.9    Bilirubin Total 0.8 0.5 0.7  0.8 0.5    Alkaline Phosphatase 66 60 62  61 61 R    LDH 176 151 168  190 178    AST 20 19 15  17 15     ALT 31 33 21  26 18     GGT 14 14 13  13 13     Iron 164 114 147  139 110    Cholesterol, Total 173 168 157  144 150    Triglycerides 129 159 High  110  103 119    HDL 41 43 39 Low   39 Low  40    VLDL Cholesterol Cal 23 28 20  19 22     LDL Chol Calc (NIH) 109 High  97 98  86 88    Chol/HDL Ratio 4.2 3.9 CM 4.0 CM  3.7 CM 3.8 CM    Comment:  T. Chol/HDL Ratio                                             Men  Women                               1/2 Avg.Risk  3.4    3.3                                   Avg.Risk  5.0    4.4                                2X Avg.Risk  9.6    7.1                                3X Avg.Risk 23.4   11.0  Estimated CHD Risk 0.8 0.7 CM 0.7 CM  0.6 CM 0.7 CM    Comment: The CHD Risk is based on the T. Chol/HDL ratio. Other factors affect CHD Risk such as hypertension, smoking, diabetes, severe obesity, and family history of premature CHD.  TSH 2.350 2.390 2.070  2.400 2.100    T4, Total 7.5 7.0 7.8  7.2 7.0    T3 Uptake Ratio 28 28 26  27 31     Free Thyroxine Index 2.1 2.0 2.0  1.9 2.2    Prostate Specific Ag, Serum 0.5 0.5 CM        Comment: Roche ECLIA methodology. According to the American Urological Association, Serum PSA should decrease and remain at undetectable levels after radical prostatectomy. The AUA defines biochemical recurrence as an initial PSA value 0.2 ng/mL or greater followed by a subsequent confirmatory PSA value 0.2 ng/mL or greater. Values obtained with different assay methods or kits cannot be used interchangeably. Results cannot be interpreted as absolute evidence of the presence or absence of malignant disease.  WBC 6.8 6.9 6.2  6.3 6.2  14.1 High  R  RBC 5.34 5.24 5.35  5.34 5.33  5.01 R  Hemoglobin 16.1 15.6 15.9  15.7 15.6  15.1 R  Hematocrit 48.4 45.9 45.9  45.5 45.8  43.1 R  MCV 91 88 86  85 86  86.0 R  MCH 30.1 29.8 29.7  29.4 29.3  30.1 R  MCHC 33.3 34.0 34.6  34.5 34.1  35.0 R  RDW 12.9 13.3 12.5  12.6 12.5  12.2 R  Platelets 256 246 256  236 270  257 R  Neutrophils 48 51 47  57 50    Lymphs 37 34 39  30 36    Monocytes 10 9 10  9 10     Eos 4 5 3  3 3     Basos 1 1 1  1 1     Neutrophils Absolute 3.3 3.5 2.9  3.7 3.2    Lymphocytes Absolute 2.5 2.4 2.4  1.9 2.2    Monocytes Absolute 0.7 0.6 0.6  0.6 0.6    EOS (ABSOLUTE) 0.3  0.4 0.2  0.2 0.2    Basophils Absolute 0.0 0.0 0.1  0.0 0.1    Immature Granulocytes 0 0 0  0  0    Immature Grans (Abs) 0.0 0.0 0.0  0.0 0.0                ____________________________________________  EKG  Sinus rhythm 87 bpm ____________________________________________     ____________________________________________   INITIAL IMPRESSION / ASSESSMENT AND PLAN / ED COURSE  As part of my medical decision making, I reviewed the following data within the electronic MEDICAL RECORD NUMBER      No acute findings on physical exam or EKG.  Labs reveal chronic proteinuria.  Discussed rationale for further evaluation from nephrology.        ____________________________________________   FINAL CLINICAL IMPRESSION(S) / ED DIAGNOSES  Well exam with persistent proteinuria   ED Discharge Orders     None        Note:  This document was prepared using Dragon voice recognition software and may include unintentional dictation errors.

## 2023-08-28 NOTE — Addendum Note (Signed)
 Addended by: Walt Gunner on: 08/28/2023 08:46 AM   Modules accepted: Orders

## 2023-08-28 NOTE — Progress Notes (Signed)
 Nephrology referral for [facility] put in Epic & faxed to [phone].

## 2023-09-18 ENCOUNTER — Encounter: Payer: Self-pay | Admitting: Physician Assistant

## 2023-09-18 ENCOUNTER — Ambulatory Visit: Payer: Self-pay | Admitting: Physician Assistant

## 2023-09-18 VITALS — BP 130/80 | HR 90 | Resp 16 | Ht 76.0 in | Wt 220.0 lb

## 2023-09-18 DIAGNOSIS — H6993 Unspecified Eustachian tube disorder, bilateral: Secondary | ICD-10-CM

## 2023-09-18 DIAGNOSIS — H60321 Hemorrhagic otitis externa, right ear: Secondary | ICD-10-CM

## 2023-09-18 MED ORDER — PSEUDOEPHEDRINE HCL ER 120 MG PO TB12: 120.0000 mg | ORAL_TABLET | Freq: Two times a day (BID) | ORAL | 0 refills | Status: AC

## 2023-09-18 MED ORDER — METHYLPREDNISOLONE 4 MG PO TBPK: ORAL_TABLET | ORAL | 0 refills | Status: AC

## 2023-09-18 NOTE — Progress Notes (Signed)
 Pt started having ear pain in right ear on the 24th. As of this morning the pain is in both ears now. Pt states yesterday he had difficulty eating opening jaw due to pain in ear. /CL,RMA

## 2023-09-18 NOTE — Progress Notes (Signed)
   Subjective: Bilateral ear pain    Patient ID: Steven Cantrell, male    DOB: 01/01/1981, 43 y.o.   MRN: [id number]  HPI Patient complaining of bilateral ear pain for 2 days.  Patient denies hearing loss or vertigo.  Denies drainage.  No use of earplugs.  Review of Systems Negative except for above complaint    Objective:   Physical Exam BP 130/80  Cuff Size Large  Pulse Rate 90  Weight 220 lb (99.8 kg)  Height 6' 4 (1.93 m)  Resp 16  SpO2 98 %  HEENT is remarkable for bilateral edema.  No erythema.  No drainage. Neck is supple for lymphadenopathy or bruits. Lungs are clear to auscultation. Heart regular rate and rhythm       Assessment & Plan: Eustachian tube discomfort and otitis external   Patient advised to wear if plugs while swimming.  Patient given a prescription for Sudafed and 20 mg twice daily.  Patient also given a prescription for Medrol Dosepak.  Advised to follow-up as noted no improvement or drainage from ears.

## 2023-09-25 DIAGNOSIS — R809 Proteinuria, unspecified: Secondary | ICD-10-CM | POA: Diagnosis not present

## 2023-09-25 DIAGNOSIS — R8290 Unspecified abnormal findings in urine: Secondary | ICD-10-CM | POA: Diagnosis not present

## 2023-09-25 DIAGNOSIS — Z720 Tobacco use: Secondary | ICD-10-CM | POA: Diagnosis not present

## 2023-10-01 ENCOUNTER — Other Ambulatory Visit: Payer: Self-pay | Admitting: Nephrology

## 2023-10-01 DIAGNOSIS — R8290 Unspecified abnormal findings in urine: Secondary | ICD-10-CM

## 2023-10-01 DIAGNOSIS — R809 Proteinuria, unspecified: Secondary | ICD-10-CM

## 2023-10-07 ENCOUNTER — Ambulatory Visit
Admission: RE | Admit: 2023-10-07 | Discharge: 2023-10-07 | Disposition: A | Discharge status: Home / Self Care | Source: Ambulatory Visit | Attending: Nephrology | Admitting: Nephrology

## 2023-10-07 DIAGNOSIS — R8290 Unspecified abnormal findings in urine: Secondary | ICD-10-CM | POA: Diagnosis not present

## 2023-10-07 DIAGNOSIS — R809 Proteinuria, unspecified: Secondary | ICD-10-CM | POA: Diagnosis not present

## 2023-11-04 DIAGNOSIS — R809 Proteinuria, unspecified: Secondary | ICD-10-CM | POA: Diagnosis not present

## 2023-11-04 DIAGNOSIS — Z720 Tobacco use: Secondary | ICD-10-CM | POA: Diagnosis not present
